# Patient Record
Sex: Female | Born: 1980 | Race: White | Hispanic: No | Marital: Married | Smoking: Never smoker
Health system: Southern US, Community
[De-identification: ages and names within clinical notes are randomized; demographics above are authoritative.]

## PROBLEM LIST (undated history)

## (undated) ENCOUNTER — Inpatient Hospital Stay (HOSPITAL_COMMUNITY): Payer: Self-pay

## (undated) DIAGNOSIS — F419 Anxiety disorder, unspecified: Secondary | ICD-10-CM

## (undated) DIAGNOSIS — Z98811 Dental restoration status: Secondary | ICD-10-CM

## (undated) DIAGNOSIS — D649 Anemia, unspecified: Secondary | ICD-10-CM

## (undated) DIAGNOSIS — F329 Major depressive disorder, single episode, unspecified: Secondary | ICD-10-CM

## (undated) DIAGNOSIS — Z8614 Personal history of Methicillin resistant Staphylococcus aureus infection: Secondary | ICD-10-CM

## (undated) DIAGNOSIS — S52501A Unspecified fracture of the lower end of right radius, initial encounter for closed fracture: Secondary | ICD-10-CM

## (undated) DIAGNOSIS — F32A Depression, unspecified: Secondary | ICD-10-CM

---

## 2004-01-25 ENCOUNTER — Other Ambulatory Visit: Admission: RE | Admit: 2004-01-25 | Discharge: 2004-01-25 | Payer: Self-pay | Admitting: Internal Medicine

## 2010-05-17 ENCOUNTER — Ambulatory Visit: Payer: Self-pay | Admitting: Internal Medicine

## 2010-05-17 DIAGNOSIS — Z888 Allergy status to other drugs, medicaments and biological substances status: Secondary | ICD-10-CM

## 2011-01-15 NOTE — Assessment & Plan Note (Signed)
Summary: RASH (PT RECOVERING FROM STAPH INFECTION) // RS   Vital Signs:  Patient profile:   30 year old female Height:      64.5 inches Weight:      150 pounds BMI:     25.44 Temp:     98.4 degrees F oral BP sitting:   96 / 64  (right arm) Cuff size:   regular  Vitals Entered By: Duard Brady LPN (May 17, 3473 10:14 AM) CC: new pt - c/o rash to torso noted this AM -  Is Patient Diabetic? No   CC:  new pt - c/o rash to torso noted this AM - .  History of Present Illness: 30 year old patient who is seen today to establish with the practice.  she is completing a 10 day course of sulfa for an infected sebaceous cyst that was I&D [place]  Today, she developed a generalized erythematous back to her rash.  Her health has been excellent  Preventive Screening-Counseling & Management  Alcohol-Tobacco     Smoking Status: never  Allergies (verified): 1)  ! Sulfa  Past History:  Past Medical History: sulfa allergy  Past Surgical History: none  Family History: Reviewed history and no changes required. mother with hypertension paternal grandfather died of lung cancer  Social History: Reviewed history and no changes required. 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  The patient denies anorexia, fever, weight loss, weight gain, vision loss, decreased hearing, hoarseness, chest pain, syncope, dyspnea on exertion, peripheral edema, prolonged cough, headaches, hemoptysis, abdominal pain, melena, hematochezia, severe indigestion/heartburn, hematuria, incontinence, genital sores, muscle weakness, transient blindness, difficulty walking, depression, unusual weight change, abnormal bleeding, enlarged lymph nodes, angioedema, and breast masses.    Physical Exam  General:  Well-developed,well-nourished,in no acute distress; alert,appropriate and cooperative throughout  examination Head:  Normocephalic and atraumatic without obvious abnormalities. No apparent alopecia or balding. Eyes:  No corneal or conjunctival inflammation noted. EOMI. Perrla. Funduscopic exam benign, without hemorrhages, exudates or papilledema. Vision grossly normal. Ears:  External ear exam shows no significant lesions or deformities.  Otoscopic examination reveals clear canals, tympanic membranes are intact bilaterally without bulging, retraction, inflammation or discharge. Hearing is grossly normal bilaterally. Mouth:  markedly enlarged tonsils, especially the left Neck:  No deformities, masses, or tenderness noted. Lungs:  Normal respiratory effort, chest expands symmetrically. Lungs are clear to auscultation, no crackles or wheezes. Heart:  Normal rate and regular rhythm. S1 and S2 normal without gallop, murmur, click, rub or other extra sounds. Abdomen:  Bowel sounds positive,abdomen soft and non-tender without masses, organomegaly or hernias noted. Msk:  No deformity or scoliosis noted of thoracic or lumbar spine.   Pulses:  R and L carotid,radial,femoral,dorsalis pedis and posterior tibial pulses are full and equal bilaterally Extremities:  No clubbing, cyanosis, edema, or deformity noted with normal full range of motion of all joints.   Neurologic:  No cranial nerve deficits noted. Station and gait are normal. Plantar reflexes are down-going bilaterally. DTRs are symmetrical throughout. Sensory, motor and coordinative functions appear intact. Skin:  generalized erythematous, macular rash   Impression & Recommendations:  Problem # 1:  ADVERSE DRUG REACTION, SULFA (ICD-995.27)  Complete Medication List: 1)  Septra Ds 800-160 Mg Tabs (Sulfamethoxazole-trimethoprim) .... X 10day  Patient Instructions: 1)  discontinue sulfa antibiotic 2)  Please schedule a follow-up appointment as needed.

## 2011-04-15 ENCOUNTER — Emergency Department (HOSPITAL_COMMUNITY)
Admission: EM | Admit: 2011-04-15 | Discharge: 2011-04-16 | Discharge status: Against Medical Advice | Payer: Self-pay | Attending: Emergency Medicine | Admitting: Emergency Medicine

## 2011-04-15 DIAGNOSIS — R079 Chest pain, unspecified: Secondary | ICD-10-CM | POA: Insufficient documentation

## 2011-06-22 ENCOUNTER — Emergency Department (HOSPITAL_COMMUNITY)
Admission: EM | Admit: 2011-06-22 | Discharge: 2011-06-22 | Disposition: A | Discharge status: Home / Self Care | Payer: Medicaid Other | Attending: Emergency Medicine | Admitting: Emergency Medicine

## 2011-06-22 DIAGNOSIS — O210 Mild hyperemesis gravidarum: Secondary | ICD-10-CM | POA: Insufficient documentation

## 2011-06-22 DIAGNOSIS — O99891 Other specified diseases and conditions complicating pregnancy: Secondary | ICD-10-CM | POA: Insufficient documentation

## 2011-06-22 DIAGNOSIS — R109 Unspecified abdominal pain: Secondary | ICD-10-CM | POA: Insufficient documentation

## 2011-06-22 LAB — URINALYSIS, ROUTINE W REFLEX MICROSCOPIC
Bilirubin Urine: NEGATIVE
Ketones, ur: NEGATIVE mg/dL
Nitrite: NEGATIVE
Protein, ur: NEGATIVE mg/dL
Specific Gravity, Urine: 1.028 (ref 1.005–1.030)
Urobilinogen, UA: 0.2 mg/dL (ref 0.0–1.0)

## 2011-06-22 LAB — POCT PREGNANCY, URINE: Preg Test, Ur: POSITIVE

## 2011-06-22 LAB — WET PREP, GENITAL: Trich, Wet Prep: NONE SEEN

## 2011-06-24 LAB — GC/CHLAMYDIA PROBE AMP, GENITAL: Chlamydia, DNA Probe: NEGATIVE

## 2011-07-10 LAB — RPR: RPR: NONREACTIVE

## 2011-07-10 LAB — RUBELLA ANTIBODY, IGM: Rubella: NON-IMMUNE/NOT IMMUNE

## 2011-07-10 LAB — ABO/RH

## 2011-07-10 LAB — HEPATITIS B SURFACE ANTIGEN: Hepatitis B Surface Ag: NEGATIVE

## 2011-08-09 ENCOUNTER — Emergency Department (HOSPITAL_COMMUNITY)
Admission: EM | Admit: 2011-08-09 | Discharge: 2011-08-10 | Disposition: A | Discharge status: Home / Self Care | Payer: Medicaid Other | Attending: Emergency Medicine | Admitting: Emergency Medicine

## 2011-08-09 DIAGNOSIS — O99891 Other specified diseases and conditions complicating pregnancy: Secondary | ICD-10-CM | POA: Insufficient documentation

## 2011-08-09 DIAGNOSIS — R51 Headache: Secondary | ICD-10-CM | POA: Insufficient documentation

## 2011-08-09 DIAGNOSIS — R109 Unspecified abdominal pain: Secondary | ICD-10-CM | POA: Insufficient documentation

## 2011-08-09 DIAGNOSIS — O210 Mild hyperemesis gravidarum: Secondary | ICD-10-CM | POA: Insufficient documentation

## 2011-08-09 LAB — POCT I-STAT, CHEM 8
Calcium, Ion: 1.17 mmol/L (ref 1.12–1.32)
Creatinine, Ser: 0.6 mg/dL (ref 0.50–1.10)
Glucose, Bld: 89 mg/dL (ref 70–99)
HCT: 36 % (ref 36.0–46.0)
Hemoglobin: 12.2 g/dL (ref 12.0–15.0)

## 2011-08-09 LAB — URINALYSIS, ROUTINE W REFLEX MICROSCOPIC
Nitrite: NEGATIVE
Protein, ur: NEGATIVE mg/dL
Specific Gravity, Urine: 1.025 (ref 1.005–1.030)
Urobilinogen, UA: 1 mg/dL (ref 0.0–1.0)

## 2011-08-09 LAB — POCT PREGNANCY, URINE: Preg Test, Ur: POSITIVE

## 2011-08-09 LAB — URINE MICROSCOPIC-ADD ON

## 2011-08-10 LAB — DIFFERENTIAL
Basophils Absolute: 0 10*3/uL (ref 0.0–0.1)
Eosinophils Absolute: 0.3 10*3/uL (ref 0.0–0.7)
Eosinophils Relative: 4 % (ref 0–5)
Lymphs Abs: 2.2 10*3/uL (ref 0.7–4.0)
Monocytes Absolute: 0.5 10*3/uL (ref 0.1–1.0)

## 2011-08-10 LAB — CBC
HCT: 35.1 % — ABNORMAL LOW (ref 36.0–46.0)
MCHC: 35 g/dL (ref 30.0–36.0)
MCV: 82.8 fL (ref 78.0–100.0)
Platelets: 206 10*3/uL (ref 150–400)
RDW: 12 % (ref 11.5–15.5)
WBC: 9.1 10*3/uL (ref 4.0–10.5)

## 2011-08-11 LAB — URINE CULTURE: Culture  Setup Time: 201208250234

## 2011-11-12 ENCOUNTER — Other Ambulatory Visit: Payer: Self-pay | Admitting: Obstetrics

## 2011-11-12 ENCOUNTER — Inpatient Hospital Stay (HOSPITAL_COMMUNITY)
Admission: AD | Admit: 2011-11-12 | Discharge: 2011-11-12 | Disposition: A | Discharge status: Home / Self Care | Payer: Medicaid Other | Source: Ambulatory Visit | Attending: Obstetrics | Admitting: Obstetrics

## 2011-11-12 DIAGNOSIS — Z298 Encounter for other specified prophylactic measures: Secondary | ICD-10-CM | POA: Insufficient documentation

## 2011-11-12 DIAGNOSIS — Z3480 Encounter for supervision of other normal pregnancy, unspecified trimester: Secondary | ICD-10-CM | POA: Insufficient documentation

## 2011-11-12 DIAGNOSIS — Z2989 Encounter for other specified prophylactic measures: Secondary | ICD-10-CM | POA: Insufficient documentation

## 2011-11-12 MED ORDER — RHO D IMMUNE GLOBULIN 1500 UNIT/2ML IJ SOLN
300.0000 ug | Freq: Once | INTRAMUSCULAR | Status: AC
  Administered 2011-11-12: 300 ug via INTRAMUSCULAR
  Filled 2011-11-12: qty 2

## 2011-11-12 NOTE — Progress Notes (Signed)
Sent over for rhophyllac inj, received with 1st preg, had a miscarriage.  Hand out given. Pt is 29wks.  No complication or complaints.  NKDA.  Time associated with lab draw and injection discussed.

## 2011-11-13 LAB — RH IG WORKUP (INCLUDES ABO/RH)
Antibody Screen: NEGATIVE
Fetal Screen: NEGATIVE
Gestational Age(Wks): 29

## 2011-12-17 NOTE — L&D Delivery Note (Signed)
Delivery Note At 7:55 AM a viable female was delivered via  (Presentation: ;  ).  APGAR: , ; weight .   Placenta status: , .  Cord:  with the following complications: .  Cord pH: not done  Anesthesia:   Episiotomy:  Lacerations:  Suture Repair: 2.0 Est. Blood Loss (mL):   Mom to postpartum.  Baby to nursery-stable.  Majorie Santee A 01/31/2012, 8:08 AM

## 2012-01-01 ENCOUNTER — Other Ambulatory Visit (HOSPITAL_COMMUNITY): Payer: Self-pay | Admitting: Obstetrics

## 2012-01-01 DIAGNOSIS — R52 Pain, unspecified: Secondary | ICD-10-CM

## 2012-01-02 ENCOUNTER — Ambulatory Visit (HOSPITAL_COMMUNITY)
Admission: RE | Admit: 2012-01-02 | Discharge: 2012-01-02 | Disposition: A | Discharge status: Home / Self Care | Payer: Medicaid Other | Source: Ambulatory Visit | Attending: Obstetrics | Admitting: Obstetrics

## 2012-01-02 DIAGNOSIS — M545 Low back pain, unspecified: Secondary | ICD-10-CM | POA: Insufficient documentation

## 2012-01-02 DIAGNOSIS — O99891 Other specified diseases and conditions complicating pregnancy: Secondary | ICD-10-CM | POA: Insufficient documentation

## 2012-01-02 DIAGNOSIS — R52 Pain, unspecified: Secondary | ICD-10-CM | POA: Insufficient documentation

## 2012-01-12 ENCOUNTER — Encounter (HOSPITAL_COMMUNITY): Payer: Self-pay | Admitting: *Deleted

## 2012-01-12 ENCOUNTER — Inpatient Hospital Stay (HOSPITAL_COMMUNITY)
Admission: AD | Admit: 2012-01-12 | Discharge: 2012-01-12 | Disposition: A | Discharge status: Home / Self Care | Payer: Medicaid Other | Source: Ambulatory Visit | Attending: Obstetrics | Admitting: Obstetrics

## 2012-01-12 DIAGNOSIS — O471 False labor at or after 37 completed weeks of gestation: Secondary | ICD-10-CM

## 2012-01-12 DIAGNOSIS — O479 False labor, unspecified: Secondary | ICD-10-CM | POA: Insufficient documentation

## 2012-01-12 MED ORDER — OXYCODONE-ACETAMINOPHEN 5-325 MG PO TABS
2.0000 | ORAL_TABLET | Freq: Once | ORAL | Status: AC
  Administered 2012-01-12: 2 via ORAL
  Filled 2012-01-12: qty 2

## 2012-01-12 NOTE — Progress Notes (Signed)
Pt states contracting since last night.   No bleeding or leaking.

## 2012-01-12 NOTE — ED Provider Notes (Signed)
Olivia Alvarez is a 31 y.o. female presenting for contractions and back pain. Maternal Medical History:  Reason for admission: Reason for admission: contractions and nausea (mild nausea on occasion).  Back pain  Contractions: Onset was 2 days ago.   Frequency: irregular.   Perceived severity is mild.    Fetal activity: Perceived fetal activity is normal.   Last perceived fetal movement was within the past hour.    Prenatal complications: no prenatal complications Prenatal Complications - Diabetes: none.    OB History    Grav Para Term Preterm Abortions TAB SAB Ect Mult Living   1              History reviewed. No pertinent past medical history. No past surgical history on file. Family History: family history is not on file. Social History:  does not have a smoking history on file. She does not have any smokeless tobacco history on file. Her alcohol and drug histories not on file.  Review of Systems  Constitutional: Negative for fever.  Eyes: Negative for blurred vision.  Respiratory: Negative for shortness of breath.   Cardiovascular: Negative for chest pain.  Gastrointestinal: Positive for nausea (mild nausea on occasion). Negative for vomiting.  Genitourinary: Negative for dysuria, urgency and frequency.  Musculoskeletal: Positive for back pain (generalized constant back pain).  Neurological: Negative for dizziness and headaches.  Psychiatric/Behavioral: The patient is not nervous/anxious.     Dilation: 1 Effacement (%): 70 Station: -3 Exam by:: Elie Confer RN Blood pressure 100/82, pulse 100, temperature 97.8 F (36.6 C), temperature source Oral, resp. rate 18, height 5\' 6"  (1.676 m), weight 76.114 kg (167 lb 12.8 oz). Maternal Exam:  Uterine Assessment: Contraction strength is mild.  Contraction frequency is irregular.  UCs q6-9 mins  Abdomen: Estimated fetal weight is 6.5lbs.   Fetal presentation: vertex  Introitus: Normal vulva. Normal vagina.   Ferning test: not done.  Nitrazine test: not done. Amniotic fluid character: not assessed.  Pelvis: adequate for delivery.   Cervix: Cervix evaluated by digital exam.   Cervix evaluated by RN  Fetal Exam Fetal Monitor Review: Mode: ultrasound.   Baseline rate: 145.  Variability: moderate (6-25 bpm).   Pattern: accelerations present and no decelerations.    Fetal State Assessment: Category I - tracings are normal.     Physical Exam  Constitutional: She is oriented to person, place, and time. She appears well-developed and well-nourished.  HENT:  Head: Normocephalic.  Eyes: Pupils are equal, round, and reactive to light.  Neck: Normal range of motion.  Cardiovascular: Normal rate, regular rhythm and normal heart sounds.   Respiratory: Effort normal and breath sounds normal.  GI: Soft. Bowel sounds are normal. There is no tenderness.  Genitourinary: Vagina normal and uterus normal.  Musculoskeletal: Normal range of motion.  Neurological: She is alert and oriented to person, place, and time.  Skin: Skin is warm and dry.  Psychiatric: She has a normal mood and affect. Her behavior is normal. Judgment and thought content normal.    Prenatal labs: ABO, Rh: --/--/O NEG (11/27 1105) Antibody: NEG (11/27 1105) Rubella:   RPR:    HBsAg:    HIV:    GBS:     Assessment/Plan: DC home from triage, after PO Percocet 2 tabs. Given labor precautions, to keep next scheduled office appt.    HAMBY, Monifah Freehling 01/12/2012, 8:38 PM

## 2012-01-12 NOTE — Progress Notes (Signed)
Pt G1 at 37.2wks, having contractions and back pain.  Denies any problems with pregnancy.

## 2012-01-25 ENCOUNTER — Inpatient Hospital Stay (HOSPITAL_COMMUNITY)
Admission: AD | Admit: 2012-01-25 | Discharge: 2012-01-26 | Disposition: A | Discharge status: Home / Self Care | Payer: Medicaid Other | Source: Ambulatory Visit | Attending: Obstetrics | Admitting: Obstetrics

## 2012-01-25 ENCOUNTER — Encounter (HOSPITAL_COMMUNITY): Payer: Self-pay

## 2012-01-25 DIAGNOSIS — O479 False labor, unspecified: Secondary | ICD-10-CM | POA: Insufficient documentation

## 2012-01-25 NOTE — Progress Notes (Signed)
Patient is here for labor eval. She states that the ctx are q61m since 2100pm. She reports good fetal movement. Denies any vaginal bleeding or lof.

## 2012-01-25 NOTE — Progress Notes (Signed)
Dr Verdell Face notified of patient complaints, tracing, sve result. Order to discharge patient home with false labor precaution.

## 2012-01-30 ENCOUNTER — Telehealth (HOSPITAL_COMMUNITY): Payer: Self-pay | Admitting: *Deleted

## 2012-01-30 ENCOUNTER — Encounter (HOSPITAL_COMMUNITY): Payer: Self-pay | Admitting: *Deleted

## 2012-01-30 NOTE — Telephone Encounter (Signed)
Preadmission screen  

## 2012-01-31 ENCOUNTER — Encounter (HOSPITAL_COMMUNITY): Payer: Self-pay | Admitting: Anesthesiology

## 2012-01-31 ENCOUNTER — Inpatient Hospital Stay (HOSPITAL_COMMUNITY)
Admission: AD | Admit: 2012-01-31 | Discharge: 2012-02-02 | DRG: 775 | Disposition: A | Discharge status: Home / Self Care | Payer: Medicaid Other | Source: Ambulatory Visit | Attending: Obstetrics | Admitting: Obstetrics

## 2012-01-31 ENCOUNTER — Encounter (HOSPITAL_COMMUNITY): Payer: Self-pay | Admitting: *Deleted

## 2012-01-31 ENCOUNTER — Inpatient Hospital Stay (HOSPITAL_COMMUNITY): Payer: Medicaid Other | Admitting: Anesthesiology

## 2012-01-31 DIAGNOSIS — Z22330 Carrier of Group B streptococcus: Secondary | ICD-10-CM

## 2012-01-31 DIAGNOSIS — Z888 Allergy status to other drugs, medicaments and biological substances status: Secondary | ICD-10-CM

## 2012-01-31 DIAGNOSIS — O99892 Other specified diseases and conditions complicating childbirth: Principal | ICD-10-CM | POA: Diagnosis present

## 2012-01-31 LAB — CBC
HCT: 33.4 % — ABNORMAL LOW (ref 36.0–46.0)
Hemoglobin: 11.1 g/dL — ABNORMAL LOW (ref 12.0–15.0)
MCHC: 33.2 g/dL (ref 30.0–36.0)
RBC: 3.83 MIL/uL — ABNORMAL LOW (ref 3.87–5.11)
WBC: 8.2 10*3/uL (ref 4.0–10.5)

## 2012-01-31 LAB — POCT FERN TEST: Fern Test: POSITIVE

## 2012-01-31 MED ORDER — ZOLPIDEM TARTRATE 5 MG PO TABS: 5.0000 mg | ORAL_TABLET | Freq: Every evening | ORAL | Status: DC | PRN

## 2012-01-31 MED ORDER — BENZOCAINE-MENTHOL 20-0.5 % EX AERO
INHALATION_SPRAY | CUTANEOUS | Status: AC
  Filled 2012-01-31: qty 56

## 2012-01-31 MED ORDER — PENICILLIN G POTASSIUM 5000000 UNITS IJ SOLR
2.5000 10*6.[IU] | INTRAVENOUS | Status: DC
  Administered 2012-01-31: 2.5 10*6.[IU] via INTRAVENOUS
  Filled 2012-01-31 (×3): qty 2.5

## 2012-01-31 MED ORDER — EPHEDRINE 5 MG/ML INJ: 10.0000 mg | INTRAVENOUS | Status: DC | PRN

## 2012-01-31 MED ORDER — IBUPROFEN 600 MG PO TABS
600.0000 mg | ORAL_TABLET | Freq: Four times a day (QID) | ORAL | Status: DC
  Administered 2012-01-31 – 2012-02-02 (×9): 600 mg via ORAL
  Filled 2012-01-31 (×2): qty 1
  Filled 2012-01-31: qty 2
  Filled 2012-01-31 (×4): qty 1

## 2012-01-31 MED ORDER — IBUPROFEN 600 MG PO TABS: 600.0000 mg | ORAL_TABLET | Freq: Four times a day (QID) | ORAL | Status: DC | PRN

## 2012-01-31 MED ORDER — FENTANYL 2.5 MCG/ML BUPIVACAINE 1/10 % EPIDURAL INFUSION (WH - ANES)
14.0000 mL/h | INTRAMUSCULAR | Status: DC
  Administered 2012-01-31: 14 mL/h via EPIDURAL
  Filled 2012-01-31: qty 60

## 2012-01-31 MED ORDER — EPHEDRINE 5 MG/ML INJ
10.0000 mg | INTRAVENOUS | Status: DC | PRN
  Filled 2012-01-31: qty 4

## 2012-01-31 MED ORDER — PENICILLIN G POTASSIUM 5000000 UNITS IJ SOLR
5.0000 10*6.[IU] | Freq: Once | INTRAVENOUS | Status: AC
  Administered 2012-01-31: 5 10*6.[IU] via INTRAVENOUS
  Filled 2012-01-31: qty 5

## 2012-01-31 MED ORDER — LIDOCAINE HCL (PF) 1 % IJ SOLN
INTRAMUSCULAR | Status: DC | PRN
  Administered 2012-01-31 (×3): 4 mL

## 2012-01-31 MED ORDER — TERBUTALINE SULFATE 1 MG/ML IJ SOLN: 0.2500 mg | Freq: Once | INTRAMUSCULAR | Status: DC | PRN

## 2012-01-31 MED ORDER — ONDANSETRON HCL 4 MG/2ML IJ SOLN
4.0000 mg | Freq: Four times a day (QID) | INTRAMUSCULAR | Status: DC | PRN
  Administered 2012-01-31: 4 mg via INTRAVENOUS
  Filled 2012-01-31: qty 2

## 2012-01-31 MED ORDER — FLEET ENEMA 7-19 GM/118ML RE ENEM: 1.0000 | ENEMA | RECTAL | Status: DC | PRN

## 2012-01-31 MED ORDER — ONDANSETRON HCL 4 MG PO TABS: 4.0000 mg | ORAL_TABLET | ORAL | Status: DC | PRN

## 2012-01-31 MED ORDER — OXYTOCIN BOLUS FROM INFUSION
500.0000 mL | Freq: Once | INTRAVENOUS | Status: DC
  Filled 2012-01-31: qty 500

## 2012-01-31 MED ORDER — BUTORPHANOL TARTRATE 2 MG/ML IJ SOLN: 1.0000 mg | INTRAMUSCULAR | Status: DC | PRN

## 2012-01-31 MED ORDER — WITCH HAZEL-GLYCERIN EX PADS: 1.0000 "application " | MEDICATED_PAD | CUTANEOUS | Status: DC | PRN

## 2012-01-31 MED ORDER — SENNOSIDES-DOCUSATE SODIUM 8.6-50 MG PO TABS
2.0000 | ORAL_TABLET | Freq: Every day | ORAL | Status: DC
  Administered 2012-01-31 – 2012-02-01 (×2): 2 via ORAL

## 2012-01-31 MED ORDER — PRENATAL MULTIVITAMIN CH
1.0000 | ORAL_TABLET | Freq: Every day | ORAL | Status: DC
  Administered 2012-02-01 – 2012-02-02 (×2): 1 via ORAL
  Filled 2012-01-31 (×2): qty 1

## 2012-01-31 MED ORDER — ACETAMINOPHEN 325 MG PO TABS: 650.0000 mg | ORAL_TABLET | ORAL | Status: DC | PRN

## 2012-01-31 MED ORDER — ONDANSETRON HCL 4 MG/2ML IJ SOLN: 4.0000 mg | INTRAMUSCULAR | Status: DC | PRN

## 2012-01-31 MED ORDER — PHENYLEPHRINE 40 MCG/ML (10ML) SYRINGE FOR IV PUSH (FOR BLOOD PRESSURE SUPPORT): 80.0000 ug | PREFILLED_SYRINGE | INTRAVENOUS | Status: DC | PRN

## 2012-01-31 MED ORDER — DIPHENHYDRAMINE HCL 50 MG/ML IJ SOLN: 12.5000 mg | INTRAMUSCULAR | Status: DC | PRN

## 2012-01-31 MED ORDER — LANOLIN HYDROUS EX OINT: TOPICAL_OINTMENT | CUTANEOUS | Status: DC | PRN

## 2012-01-31 MED ORDER — OXYTOCIN 20 UNITS IN LACTATED RINGERS INFUSION - SIMPLE
125.0000 mL/h | Freq: Once | INTRAVENOUS | Status: AC
  Administered 2012-01-31: 999 mL/h via INTRAVENOUS

## 2012-01-31 MED ORDER — OXYCODONE-ACETAMINOPHEN 5-325 MG PO TABS
1.0000 | ORAL_TABLET | ORAL | Status: DC | PRN
  Administered 2012-01-31 – 2012-02-01 (×3): 1 via ORAL
  Administered 2012-02-01 (×2): 2 via ORAL
  Administered 2012-02-02: 1 via ORAL
  Filled 2012-01-31: qty 1
  Filled 2012-01-31: qty 2
  Filled 2012-01-31: qty 1

## 2012-01-31 MED ORDER — OXYCODONE-ACETAMINOPHEN 5-325 MG PO TABS
1.0000 | ORAL_TABLET | ORAL | Status: DC | PRN
  Filled 2012-01-31 (×2): qty 1
  Filled 2012-01-31: qty 2

## 2012-01-31 MED ORDER — CITRIC ACID-SODIUM CITRATE 334-500 MG/5ML PO SOLN: 30.0000 mL | ORAL | Status: DC | PRN

## 2012-01-31 MED ORDER — LIDOCAINE HCL (PF) 1 % IJ SOLN
30.0000 mL | INTRAMUSCULAR | Status: DC | PRN
  Filled 2012-01-31: qty 30

## 2012-01-31 MED ORDER — PHENYLEPHRINE 40 MCG/ML (10ML) SYRINGE FOR IV PUSH (FOR BLOOD PRESSURE SUPPORT)
80.0000 ug | PREFILLED_SYRINGE | INTRAVENOUS | Status: DC | PRN
  Filled 2012-01-31: qty 5

## 2012-01-31 MED ORDER — TETANUS-DIPHTH-ACELL PERTUSSIS 5-2.5-18.5 LF-MCG/0.5 IM SUSP: 0.5000 mL | Freq: Once | INTRAMUSCULAR | Status: DC

## 2012-01-31 MED ORDER — DIPHENHYDRAMINE HCL 25 MG PO CAPS: 25.0000 mg | ORAL_CAPSULE | Freq: Four times a day (QID) | ORAL | Status: DC | PRN

## 2012-01-31 MED ORDER — OXYTOCIN 20 UNITS IN LACTATED RINGERS INFUSION - SIMPLE
1.0000 m[IU]/min | INTRAVENOUS | Status: DC
  Administered 2012-01-31: 2 m[IU]/min via INTRAVENOUS
  Filled 2012-01-31: qty 1000

## 2012-01-31 MED ORDER — LACTATED RINGERS IV SOLN
INTRAVENOUS | Status: DC
  Administered 2012-01-31: 02:00:00 via INTRAVENOUS

## 2012-01-31 MED ORDER — FERROUS SULFATE 325 (65 FE) MG PO TABS
325.0000 mg | ORAL_TABLET | Freq: Two times a day (BID) | ORAL | Status: DC
  Administered 2012-02-01 – 2012-02-02 (×3): 325 mg via ORAL
  Filled 2012-01-31 (×3): qty 1

## 2012-01-31 MED ORDER — LACTATED RINGERS IV SOLN: 500.0000 mL | INTRAVENOUS | Status: DC | PRN

## 2012-01-31 MED ORDER — BENZOCAINE-MENTHOL 20-0.5 % EX AERO: 1.0000 "application " | INHALATION_SPRAY | CUTANEOUS | Status: DC | PRN

## 2012-01-31 MED ORDER — SIMETHICONE 80 MG PO CHEW: 80.0000 mg | CHEWABLE_TABLET | ORAL | Status: DC | PRN

## 2012-01-31 MED ORDER — LACTATED RINGERS IV SOLN: 500.0000 mL | Freq: Once | INTRAVENOUS | Status: DC

## 2012-01-31 MED ORDER — DIBUCAINE 1 % RE OINT: 1.0000 "application " | TOPICAL_OINTMENT | RECTAL | Status: DC | PRN

## 2012-01-31 NOTE — Progress Notes (Signed)
UR chart review completed.  

## 2012-01-31 NOTE — Anesthesia Procedure Notes (Signed)
Epidural Patient location during procedure: OB Start time: 01/31/2012 5:25 AM Reason for block: procedure for pain  Staffing Performed by: anesthesiologist   Preanesthetic Checklist Completed: patient identified, site marked, surgical consent, pre-op evaluation, timeout performed, IV checked, risks and benefits discussed and monitors and equipment checked  Epidural Patient position: sitting Prep: site prepped and draped and DuraPrep Patient monitoring: continuous pulse ox and blood pressure Approach: midline Injection technique: LOR air  Needle:  Needle type: Tuohy  Needle gauge: 17 G Needle length: 9 cm Catheter type: closed end flexible Catheter size: 19 Gauge Test dose: negative  Assessment Events: blood not aspirated, injection not painful, no injection resistance, negative IV test and no paresthesia  Additional Notes Discussed risk of headache, infection, bleeding, nerve injury and failed or incomplete block.  Patient voices understanding and wishes to proceed.

## 2012-01-31 NOTE — H&P (Signed)
This is Dr. 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

## 2012-01-31 NOTE — Anesthesia Preprocedure Evaluation (Signed)
Anesthesia Evaluation  Patient identified by MRN, date of birth, ID band Patient awake    Reviewed: Allergy & Precautions, H&P , NPO status , Patient's Chart, lab work & pertinent test results, reviewed documented beta blocker date and time   History of Anesthesia Complications Negative for: history of anesthetic complications  Airway Mallampati: I TM Distance: >3 FB Neck ROM: full    Dental  (+) Teeth Intact   Pulmonary neg pulmonary ROS,  clear to auscultation        Cardiovascular neg cardio ROS regular Normal    Neuro/Psych  Neuromuscular disease (has been on tylox for sciatica for several months now) Negative Psych ROS   GI/Hepatic Neg liver ROS, On reglan for nausea   Endo/Other  Negative Endocrine ROS  Renal/GU negative Renal ROS  Genitourinary negative   Musculoskeletal   Abdominal   Peds  Hematology negative hematology ROS (+)   Anesthesia Other Findings   Reproductive/Obstetrics (+) Pregnancy                           Anesthesia Physical Anesthesia Plan  ASA: II  Anesthesia Plan: Epidural   Post-op Pain Management:    Induction:   Airway Management Planned:   Additional Equipment:   Intra-op Plan:   Post-operative Plan:   Informed Consent: I have reviewed the patients History and Physical, chart, labs and discussed the procedure including the risks, benefits and alternatives for the proposed anesthesia with the patient or authorized representative who has indicated his/her understanding and acceptance.     Plan Discussed with:   Anesthesia Plan Comments:         Anesthesia Quick Evaluation

## 2012-01-31 NOTE — Progress Notes (Signed)
PT SAYS SROM AT 2345-  CLEAR.   NO FLUID NOW    DENIES HSV AND MRSA.  VE ON 13TH- 1 CM.  [facility] INDUCTION FOR MONDAY

## 2012-02-01 LAB — CBC
HCT: 30.8 % — ABNORMAL LOW (ref 36.0–46.0)
MCV: 88.8 fL (ref 78.0–100.0)
Platelets: 203 10*3/uL (ref 150–400)
RBC: 3.47 MIL/uL — ABNORMAL LOW (ref 3.87–5.11)
WBC: 11.6 10*3/uL — ABNORMAL HIGH (ref 4.0–10.5)

## 2012-02-01 NOTE — Progress Notes (Signed)
Post Partum Day 2 Subjective: no complaints  Objective: Blood pressure 129/83, pulse 64, temperature 98.3 F (36.8 C), temperature source Oral, resp. rate 20, height 5\' 6"  (1.676 m), weight 174 lb (78.926 kg), SpO2 96.00%, unknown if currently breastfeeding.  Physical Exam:  General: alert and no distress Lochia: appropriate Uterine Fundus: firm Incision: healing well DVT Evaluation: No evidence of DVT seen on physical exam.   Basename 01/31/12 0130  HGB 11.1*  HCT 33.4*    Assessment/Plan: Discharge home   LOS: 1 day   Warrick Llera A 02/01/2012, 6:18 AM

## 2012-02-01 NOTE — Anesthesia Postprocedure Evaluation (Signed)
Anesthesia Post Note  Patient: Olivia Alvarez State  Procedure(s) Performed: * No procedures listed *  Anesthesia type: Epidural  Patient location: Mother/Baby  Post pain: Pain level controlled  Post assessment: Post-op Vital signs reviewed  Last Vitals:  Filed Vitals:   02/01/12 0515  BP: 129/83  Pulse: 64  Temp: 36.8 C  Resp: 20    Post vital signs: Reviewed  Level of consciousness: awake  Complications: No apparent anesthesia complications

## 2012-02-01 NOTE — Progress Notes (Signed)
PSYCHOSOCIAL ASSESSMENT ~ MATERNAL/CHILD Name:  Olivia Alvarez Age:  29 day Referral Date 02/01/12 Reason/Source  Positive UDS for Opiates  I. FAMILY/HOME ENVIRONMENT A. Child's Legal Guardian  Parent(s) X Olivia Alvarez parent    DSS  Name  Olivia Alvarez (MOB) DOB  March 23, 1981 Age  31 y/o Address [address] [place], [place]  [address] Name  Olivia Alvarez (FOB) DOB Age C.   Other Support -  Clint's mother was also visiting today and interacting with baby.  II. PSYCHOSOCIAL DATA A. Information Source  Patient Interview X  Family Interview           Other B. Event organiser  Employment    [employer]  Yes   [employer]                              Self Pay   Food Stamps (Will re-apply)   WIC  Work Scientist, physiological Housing      Section 8     Maternity Care Coordination/Child Service Coordination/Early Intervention    School  Grade      Other Cultural and Environment Information Cultural Issues Impacting Care  III. STRENGTHS  Supportive family/friends  Yes   Adequate Resources  Yes Compliance with medical plan Yes Home prepared for Child (including basic supplies)  Yes Understanding of illness N/A           Other  IV. RISK FACTORS AND CURRENT PROBLEMS V. No Problems Note VI. Substance Abuse                                           Pt - History of prescription drug abuse.  Family             Mental Illness     Pt Family               Family/Relationship Issues   Pt Family      Abuse/Neglect/Domestic Violence   Pt Family   Financial Resources     Pt Family  Transportation     Pt Family  DSS Involvement    Pt Family  Adjustment to Illness    Pt Family   Knowledge/Cognitive Deficit   Pt Family   Compliance with Treatment   Pt Family   Basic Needs (food, housing, etc)  Pt Family  Housing Concerns    Pt Family  Other             VII. SOCIAL WORK ASSESSMENT SW received referral due to  positive UDS screen of baby for opiates.  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  She obtained Methadone from a friend and attempted to ween herself off the meds and may have used the Methadone in the early stage of her pregnancy.  MOB disclosed this information to her MD who recommended she be admitted to a drug treatment facility but she refused.  MOB said she has been prescribed Tylox by Dr. Gaynell Face, which she uses for back pain.  Confirmed this via chart review.  MOB said she took a 5-500 Tylox on 01/30/12.  MOB was informed the UDS was positive for baby and  she said she understood.  SW also informed her about the pending [facility] screen.  SW consulted unit RN, Milagros Reap, and Nursery RN, [employer].  They have been monitoring baby for withdrawal symptoms.  SW also consulted Nobie Putnam, LCSW.  SW concluded that due to inconclusive evidence of possible prolonged drug exposure to the baby, DSS was contacted.  SW gave report to [facility] of CPS [place] [phone], who said she would follow up with MOB and baby.  SW attempted to inform MOB that DSS was contacted but pt declined visit.  SW did inform MOB on first visit that there was a possibility DSS would be contacted due to positive drug screen.  Will follow up with MOB tomorrow.  MOB has Medicaid and adequate supplies for baby at home.  MOB stated she has a good support system.       VIII. SOCIAL WORK PLAN (In [place]) No Further Intervention Required/No Barriers to Discharge Psychosocial Support and Ongoing Assessment of Needs Patient/Family  Education Child Protective Services Report  [facility] DSS Randa Evens [place]     Date 02/01/12 Information/Referral to [employer]

## 2012-02-02 MED ORDER — OXYCODONE-ACETAMINOPHEN 5-500 MG PO CAPS: 1.0000 | ORAL_CAPSULE | ORAL | Status: DC | PRN

## 2012-02-02 MED ORDER — IBUPROFEN 600 MG PO TABS: 600.0000 mg | ORAL_TABLET | Freq: Four times a day (QID) | ORAL | Status: DC

## 2012-02-02 NOTE — Discharge Summary (Signed)
Obstetric Discharge Summary Reason for Admission: onset of labor Prenatal Procedures: ultrasound Intrapartum Procedures: spontaneous vaginal delivery Postpartum Procedures: none Complications-Operative and Postpartum: none Hemoglobin  Date Value Range Status  02/01/2012 10.2* 12.0-15.0 (g/dL) Final     HCT  Date Value Range Status  02/01/2012 30.8* 36.0-46.0 (%) Final    Discharge Diagnoses: Term Pregnancy-delivered  Discharge Information: Date: 02/02/2012 Activity: pelvic rest Diet: routine Medications: PNV, Ibuprofen and Percocet Condition: stable Instructions: refer to practice specific booklet Discharge to: home Follow-up Information    Follow up with MARSHALL,BERNARD A, MD. Schedule an appointment as soon as possible for a visit in 6 weeks.   Contact information:   [address] [place] [address] [phone]          Newborn Data: Live born female  Birth Weight: 6 lb 6.7 oz (2912 g) APGAR: 9, 9  Home with mother.  Corinna Burkman A 02/02/2012, 10:19 AM

## 2012-02-02 NOTE — Progress Notes (Signed)
Pt was informed CPS was given a report regarding her baby's UDS being positive for opiates.  She stated she understood.

## 2012-02-02 NOTE — Progress Notes (Signed)
Post Partum Day 2 Subjective: no complaints  Objective: Blood pressure 131/84, pulse 69, temperature 98.4 F (36.9 C), temperature source Oral, resp. rate 18, height 5\' 6"  (1.676 m), weight 174 lb (78.926 kg), SpO2 96.00%, unknown if currently breastfeeding.  Physical Exam:  General: alert and no distress Lochia: appropriate Uterine Fundus: firm Incision: healing well DVT Evaluation: No evidence of DVT seen on physical exam.   Basename 02/01/12 0604 01/31/12 0130  HGB 10.2* 11.1*  HCT 30.8* 33.4*    Assessment/Plan: Discharge home   LOS: 2 days   Tymira Horkey A 02/02/2012, 10:15 AM

## 2012-02-03 ENCOUNTER — Inpatient Hospital Stay (HOSPITAL_COMMUNITY): Admission: RE | Admit: 2012-02-03 | Payer: Medicaid Other | Source: Ambulatory Visit

## 2012-11-26 ENCOUNTER — Emergency Department (HOSPITAL_COMMUNITY)
Admission: EM | Admit: 2012-11-26 | Discharge: 2012-11-26 | Disposition: A | Discharge status: Home / Self Care | Payer: Medicaid Other | Attending: Emergency Medicine | Admitting: Emergency Medicine

## 2012-11-26 ENCOUNTER — Emergency Department (HOSPITAL_COMMUNITY): Payer: Medicaid Other

## 2012-11-26 ENCOUNTER — Encounter (HOSPITAL_COMMUNITY): Payer: Self-pay

## 2012-11-26 DIAGNOSIS — S46909A Unspecified injury of unspecified muscle, fascia and tendon at shoulder and upper arm level, unspecified arm, initial encounter: Secondary | ICD-10-CM | POA: Insufficient documentation

## 2012-11-26 DIAGNOSIS — S4980XA Other specified injuries of shoulder and upper arm, unspecified arm, initial encounter: Secondary | ICD-10-CM | POA: Insufficient documentation

## 2012-11-26 DIAGNOSIS — IMO0002 Reserved for concepts with insufficient information to code with codable children: Secondary | ICD-10-CM | POA: Insufficient documentation

## 2012-11-26 DIAGNOSIS — Z8619 Personal history of other infectious and parasitic diseases: Secondary | ICD-10-CM | POA: Insufficient documentation

## 2012-11-26 DIAGNOSIS — Z3202 Encounter for pregnancy test, result negative: Secondary | ICD-10-CM | POA: Insufficient documentation

## 2012-11-26 DIAGNOSIS — Y92410 Unspecified street and highway as the place of occurrence of the external cause: Secondary | ICD-10-CM | POA: Insufficient documentation

## 2012-11-26 DIAGNOSIS — G8929 Other chronic pain: Secondary | ICD-10-CM | POA: Insufficient documentation

## 2012-11-26 DIAGNOSIS — T148XXA Other injury of unspecified body region, initial encounter: Secondary | ICD-10-CM

## 2012-11-26 DIAGNOSIS — Y9389 Activity, other specified: Secondary | ICD-10-CM | POA: Insufficient documentation

## 2012-11-26 DIAGNOSIS — M549 Dorsalgia, unspecified: Secondary | ICD-10-CM | POA: Insufficient documentation

## 2012-11-26 MED ORDER — NAPROXEN 250 MG PO TABS: 250.0000 mg | ORAL_TABLET | Freq: Two times a day (BID) | ORAL | Status: DC

## 2012-11-26 MED ORDER — CYCLOBENZAPRINE HCL 10 MG PO TABS: 10.0000 mg | ORAL_TABLET | Freq: Three times a day (TID) | ORAL | Status: DC | PRN

## 2012-11-26 NOTE — ED Notes (Signed)
Patient transported to X-ray 
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
## 2012-11-26 NOTE — ED Notes (Addendum)
Pt returns from radiology pt pain remains

## 2012-11-26 NOTE — ED Notes (Signed)
Patient transported to CT 
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
## 2014-08-01 ENCOUNTER — Encounter (HOSPITAL_COMMUNITY): Payer: Self-pay | Admitting: Emergency Medicine

## 2014-08-01 ENCOUNTER — Inpatient Hospital Stay (HOSPITAL_COMMUNITY)
Admission: AD | Admit: 2014-08-01 | Discharge: 2014-08-01 | Disposition: A | Discharge status: Home / Self Care | Payer: Self-pay | Source: Ambulatory Visit | Attending: Obstetrics & Gynecology | Admitting: Obstetrics & Gynecology

## 2014-08-01 ENCOUNTER — Inpatient Hospital Stay (HOSPITAL_COMMUNITY): Payer: Medicaid Other

## 2014-08-01 ENCOUNTER — Encounter (HOSPITAL_COMMUNITY): Payer: Self-pay

## 2014-08-01 ENCOUNTER — Emergency Department (HOSPITAL_COMMUNITY)
Admission: EM | Admit: 2014-08-01 | Discharge: 2014-08-01 | Disposition: A | Discharge status: Home / Self Care | Payer: Medicaid Other | Attending: Emergency Medicine | Admitting: Emergency Medicine

## 2014-08-01 DIAGNOSIS — R109 Unspecified abdominal pain: Secondary | ICD-10-CM | POA: Insufficient documentation

## 2014-08-01 DIAGNOSIS — O200 Threatened abortion: Secondary | ICD-10-CM | POA: Insufficient documentation

## 2014-08-01 DIAGNOSIS — O4690 Antepartum hemorrhage, unspecified, unspecified trimester: Secondary | ICD-10-CM | POA: Insufficient documentation

## 2014-08-01 DIAGNOSIS — O209 Hemorrhage in early pregnancy, unspecified: Secondary | ICD-10-CM

## 2014-08-01 DIAGNOSIS — O9989 Other specified diseases and conditions complicating pregnancy, childbirth and the puerperium: Secondary | ICD-10-CM | POA: Insufficient documentation

## 2014-08-01 DIAGNOSIS — Z8619 Personal history of other infectious and parasitic diseases: Secondary | ICD-10-CM | POA: Insufficient documentation

## 2014-08-01 DIAGNOSIS — G8929 Other chronic pain: Secondary | ICD-10-CM | POA: Insufficient documentation

## 2014-08-01 LAB — BASIC METABOLIC PANEL
ANION GAP: 11 (ref 5–15)
BUN: 9 mg/dL (ref 6–23)
CALCIUM: 9.4 mg/dL (ref 8.4–10.5)
CHLORIDE: 99 meq/L (ref 96–112)
CO2: 27 mEq/L (ref 19–32)
CREATININE: 0.72 mg/dL (ref 0.50–1.10)
GFR calc non Af Amer: >90 mL/min (ref >90)
Glucose, Bld: 93 mg/dL (ref 70–99)
Potassium: 4.2 mEq/L (ref 3.7–5.3)
Sodium: 137 mEq/L (ref 137–147)

## 2014-08-01 LAB — CBC WITH DIFFERENTIAL/PLATELET
BASOS ABS: 0 10*3/uL (ref 0.0–0.1)
BASOS PCT: 0 % (ref 0–1)
EOS PCT: 3 % (ref 0–5)
Eosinophils Absolute: 0.3 10*3/uL (ref 0.0–0.7)
HEMATOCRIT: 38.3 % (ref 36.0–46.0)
Hemoglobin: 12.9 g/dL (ref 12.0–15.0)
Lymphocytes Relative: 23 % (ref 12–46)
Lymphs Abs: 1.9 10*3/uL (ref 0.7–4.0)
MCH: 29 pg (ref 26.0–34.0)
MCHC: 33.7 g/dL (ref 30.0–36.0)
MCV: 86.1 fL (ref 78.0–100.0)
MONO ABS: 0.4 10*3/uL (ref 0.1–1.0)
Monocytes Relative: 4 % (ref 3–12)
NEUTROS ABS: 6 10*3/uL (ref 1.7–7.7)
Neutrophils Relative %: 70 % (ref 43–77)
PLATELETS: 199 10*3/uL (ref 150–400)
RBC: 4.45 MIL/uL (ref 3.87–5.11)
RDW: 12.5 % (ref 11.5–15.5)
WBC: 8.6 10*3/uL (ref 4.0–10.5)

## 2014-08-01 LAB — HCG, QUANTITATIVE, PREGNANCY: hCG, Beta Chain, Quant, S: 6627 m[IU]/mL — ABNORMAL HIGH (ref <5)

## 2014-08-01 MED ORDER — PROMETHAZINE HCL 25 MG PO TABS
25.0000 mg | ORAL_TABLET | Freq: Once | ORAL | Status: AC
  Administered 2014-08-01: 25 mg via ORAL
  Filled 2014-08-01: qty 1

## 2014-08-01 MED ORDER — ONDANSETRON HCL 4 MG/2ML IJ SOLN
4.0000 mg | Freq: Once | INTRAMUSCULAR | Status: AC
  Administered 2014-08-01: 4 mg via INTRAVENOUS
  Filled 2014-08-01: qty 2

## 2014-08-01 MED ORDER — HYDROMORPHONE HCL PF 1 MG/ML IJ SOLN
0.5000 mg | Freq: Once | INTRAMUSCULAR | Status: AC
  Administered 2014-08-01: 0.5 mg via INTRAMUSCULAR
  Filled 2014-08-01: qty 1

## 2014-08-01 MED ORDER — RHO D IMMUNE GLOBULIN 1500 UNIT/2ML IJ SOSY
300.0000 ug | PREFILLED_SYRINGE | Freq: Once | INTRAMUSCULAR | Status: AC
  Administered 2014-08-01: 300 ug via INTRAMUSCULAR
  Filled 2014-08-01: qty 2

## 2014-08-01 MED ORDER — OXYCODONE-ACETAMINOPHEN 5-325 MG PO TABS: 1.0000 | ORAL_TABLET | ORAL | Status: DC | PRN

## 2014-08-01 MED ORDER — MORPHINE SULFATE 4 MG/ML IJ SOLN
4.0000 mg | Freq: Once | INTRAMUSCULAR | Status: AC
  Administered 2014-08-01: 4 mg via INTRAVENOUS
  Filled 2014-08-01: qty 1

## 2014-08-01 MED ORDER — PROMETHAZINE HCL 25 MG PO TABS: 25.0000 mg | ORAL_TABLET | Freq: Four times a day (QID) | ORAL | Status: DC | PRN

## 2014-08-01 MED ORDER — FENTANYL CITRATE 0.05 MG/ML IJ SOLN: 100.0000 ug | Freq: Once | INTRAMUSCULAR | Status: DC

## 2014-08-01 MED ORDER — OXYCODONE-ACETAMINOPHEN 5-325 MG PO TABS
2.0000 | ORAL_TABLET | Freq: Once | ORAL | Status: AC
  Administered 2014-08-01: 2 via ORAL
  Filled 2014-08-01: qty 2

## 2014-08-01 NOTE — ED Provider Notes (Signed)
Medical screening examination/treatment/procedure(s) were performed by non-physician practitioner and as supervising physician I was immediately available for consultation/collaboration.   Lalisa Kiehn L Faye Strohman, MD 08/01/14 2206 

## 2014-08-01 NOTE — MAU Provider Note (Signed)
History     CSN: [id number]  Arrival date and time: 08/01/14 1547   None     Chief Complaint  Patient presents with  . Vaginal Bleeding  . Abdominal Pain   HPI  [name] is a 33 y.o. female G3P1011 at Unknown gestation who presents to MAU with vaginal bleeding and abdominal pain. She was seen at [facility] ED earlier and sent here for an ultrasound. She was informed that based on her physical exam and the amount of vaginal bleeding she was experiencing that this was likely a SAB.   OB History   Grav Para Term Preterm Abortions TAB SAB Ect Mult Living   3 1 1  1  0 1   1      Past Medical History  Diagnosis Date  . Varicella   . Chronic back pain   . Misuse of prescription only drugs     opiates    Past Surgical History  Procedure Laterality Date  . Right arm broken      surgery    History reviewed. No pertinent family history.  History  Substance Use Topics  . Smoking status: Never Smoker   . Smokeless tobacco: Not on file  . Alcohol Use: No    Allergies:  Allergies  Allergen Reactions  . Sulfa Antibiotics Rash    Prescriptions prior to admission  Medication Sig Dispense Refill  . ibuprofen (ADVIL,MOTRIN) 200 MG tablet Take 800 mg by mouth every 6 (six) hours as needed for moderate pain.        Results for orders placed during the hospital encounter of 08/01/14 (from the past 48 hour(s))  RH IG WORKUP (INCLUDES ABO/RH)     Status: None   Collection Time    08/01/14  5:35 PM      Result Value Ref Range   Gestational Age(Wks) 5     ABO/RH(D) O NEG     Antibody Screen NEG     Unit Number [id number]     Blood Component Type RHIG     Unit division 00     Status of Unit ISSUED     Transfusion Status OK TO TRANSFUSE     US Ob Comp Less 14 Wks  08/01/2014   CLINICAL DATA:  Abdominal cramping.  Vaginal bleeding.  EXAM: OBSTETRIC <14 WK [place] AND TRANSVAGINAL OB US  TECHNIQUE: Both transabdominal and transvaginal ultrasound  examinations were performed for complete evaluation of the gestation as well as the maternal uterus, adnexal regions, and pelvic cul-de-sac. Transvaginal technique was performed to assess early pregnancy.  COMPARISON:  None.  FINDINGS: Intrauterine gestational sac: A single irregular shaped gestational sac is visualized. And appears located within the cervix.  Yolk sac:  No definite yolk sac or embryo identified.  Embryo:  No no definite yolk sac or embryo identified.  Cardiac Activity: No  MSD:  8.3  mm   5 w   3D  d  Maternal uterus/adnexae:  Subchorionic hemorrhage: Moderate subchorionic hemorrhage noted.  Right ovary: Normal  Left ovary: Normal  Other :None  Free fluid:  None  IMPRESSION: An irregularly shaped gestational sac is found within the cervix. No embryo is identified. Findings are suspicious for abortion in progress. Follow-up pelvic sonogram and serial beta HCG advised.   Electronically Signed   By: Signa Kell M.D.   On: 08/01/2014 18:56   US Ob Transvaginal  08/01/2014   CLINICAL DATA:  Abdominal cramping.  Vaginal bleeding.  EXAM: OBSTETRIC <  14 WK US AND TRANSVAGINAL OB US  TECHNIQUE: Both transabdominal and transvaginal ultrasound examinations were performed for complete evaluation of the gestation as well as the maternal uterus, adnexal regions, and pelvic cul-de-sac. Transvaginal technique was performed to assess early pregnancy.  COMPARISON:  None.  FINDINGS: Intrauterine gestational sac: A single irregular shaped gestational sac is visualized. And appears located within the cervix.  Yolk sac:  No definite yolk sac or embryo identified.  Embryo:  No no definite yolk sac or embryo identified.  Cardiac Activity: No  MSD:  8.3  mm   5 w   3D  d  Maternal uterus/adnexae:  Subchorionic hemorrhage: Moderate subchorionic hemorrhage noted.  Right ovary: Normal  Left ovary: Normal  Other :None  Free fluid:  None  IMPRESSION: An irregularly shaped gestational sac is found within the cervix. No  embryo is identified. Findings are suspicious for abortion in progress. Follow-up pelvic sonogram and serial beta HCG advised.   Electronically Signed   By: Signa Kellaylor  Stroud M.D.   On: 08/01/2014 18:56    Review of Systems  Gastrointestinal: Positive for nausea and abdominal pain (Bilateral lower abdominal pain ). Negative for vomiting, diarrhea and constipation.  Genitourinary: Negative for dysuria, urgency, frequency and hematuria.   Physical Exam   Blood pressure 111/72, pulse 92, temperature 98.9 F (37.2 C), temperature source Oral, resp. rate 20, height 5' 5.5" (1.664 m), weight 64.774 kg (142 lb 12.8 oz), SpO2 100.00%.  Physical Exam  Constitutional: She is oriented to person, place, and time. She appears well-developed and well-nourished. No distress.  HENT:  Head: Normocephalic.  Eyes: Pupils are equal, round, and reactive to light.  Neck: Neck supple.  Respiratory: Effort normal.  GI: Soft. There is tenderness in the right lower quadrant, suprapubic area and left lower quadrant.  Genitourinary:  Speculum exam: Vagina - Small amount of dark red blood in the vault, no odor Cervix - Cervix appears open; dark red clot vs gestational sac noted at cervical opening. Unable to dislodge with ring forceps.  Bimanual exam: Cervix open Uterus non tender, enlarged  Chaperone present for exam.   Neurological: She is alert and oriented to person, place, and time.  Skin: Skin is warm. She is not diaphoretic.    MAU Course  Procedures None  MDM O negative blood type  Rhogam given in MAU 0.5 mg IM Dilaudid 2 percocet and 25 mg of phenergan given at discharge.  Spoke with Dr. Bradly ChrisStroud with Radiology; likely SAB in progress.   Assessment and Plan   A:  1. Vaginal bleeding in pregnancy, first trimester ; likely SAB in progress   2. Threatened miscarriage    P:  Discharge home in stable condition Return to the clinic in 48 hours for beta hcg Bleeding precautions discussed RX:  Percocet        Phenergan   Return to MAU as needed, if symptoms worsen Support given Rhogam given   Olivia HansenJennifer Irene Jamisyn Langer, NP  08/01/2014, 8:11 PM

## 2014-08-01 NOTE — Discharge Instructions (Signed)
[facility]. Dr. Tamela OddiJackson-Moore is aware that you will be there.

## 2014-08-01 NOTE — Progress Notes (Signed)
  CARE MANAGEMENT ED NOTE 08/01/2014  Patient:  Olivia Alvarez,Olivia Alvarez   Account Number:  [id number]  Date Initiated:  08/01/2014  Documentation initiated by:  Edd ArbourGIBBS,Constantina  Subjective/Objective Assessment:   33 yr old self pay [place] resident Pt states 1 week ago she found out she was pregnant and is now having abd pain that started today and vaginal bleeding x 2 days and has gotten worse this morning. Pt has passed clots.     Subjective/Objective Assessment Detail:   no pcp, no ob gyn  Reports she just found out she was pregnant and did not have a provider  Female visitor at bedside     Action/Plan:   ED CM spoke with pt discussed [place] [facility]   Action/Plan Detail:   Anticipated DC Date:       Status Recommendation to Physician:   Result of Recommendation:    Other ED Services  Consult Working Plan    DC Planning Services  Other  PCP issues  Outpatient Services - Pt will follow up    Choice offered to / List presented to:            Status of service:  Completed, signed off  ED Comments:   ED Comments Detail:  Follow-up With Details Comments Contact Info [facility]    [address] [place] [phone] follow up with the list of [place] lf pay providers provided by Case manager  Schedule an appointment as soon as possible for a visit As needed

## 2014-08-01 NOTE — MAU Note (Signed)
[facility] and sent to [facility] for evaluation. States she started spotting last night and heavy today with a lot of cramping

## 2014-08-01 NOTE — ED Notes (Signed)
Pt states 1 week ago she found out she was pregnant and is now having abd pain that started today and vaginal bleeding x 2 days and has gotten worse this morning. Pt has passed clots.

## 2014-08-01 NOTE — ED Notes (Signed)
Kaitlyn PA notified pelvis is set up

## 2014-08-01 NOTE — ED Notes (Signed)
Kaitlyn PA is going to cancel wet prep and GC swabs because there was too much blood to get an accurate specimen

## 2014-08-01 NOTE — Discharge Instructions (Signed)
Threatened Miscarriage °A threatened miscarriage occurs when you have vaginal bleeding during your first 20 weeks of pregnancy but the pregnancy has not ended. If you have vaginal bleeding during this time, your health care provider will do tests to make sure you are still pregnant. If the tests show you are still pregnant and the developing baby (fetus) inside your womb (uterus) is still growing, your condition is considered a threatened miscarriage. °A threatened miscarriage does not mean your pregnancy will end, but it does increase the risk of losing your pregnancy (complete miscarriage). °CAUSES  °The cause of a threatened miscarriage is usually not known. If you go on to have a complete miscarriage, the most common cause is an abnormal number of chromosomes in the developing baby. Chromosomes are the structures inside cells that hold all your genetic material. °Some causes of vaginal bleeding that do not result in miscarriage include: °· Having sex. °· Having an infection. °· Normal hormone changes of pregnancy. °· Bleeding that occurs when an egg implants in your uterus. °RISK FACTORS °Risk factors for bleeding in early pregnancy include: °· Obesity. °· Smoking. °· Drinking excessive amounts of alcohol or caffeine. °· Recreational drug use. °SIGNS AND SYMPTOMS °· Light vaginal bleeding. °· Mild abdominal pain or cramps. °DIAGNOSIS  °If you have bleeding with or without abdominal pain before 20 weeks of pregnancy, your health care provider will do tests to check whether you are still pregnant. One important test involves using sound waves and a computer (ultrasound) to create images of the inside of your uterus. Other tests include an internal exam of your vagina and uterus (pelvic exam) and measurement of your baby's heart rate.  °You may be diagnosed with a threatened miscarriage if: °· Ultrasound testing shows you are still pregnant. °· Your baby's heart rate is strong. °· A pelvic exam shows that the  opening between your uterus and your vagina (cervix) is closed. °· Your heart rate and blood pressure are stable. °· Blood tests confirm you are still pregnant. °TREATMENT  °No treatments have been shown to prevent a threatened miscarriage from going on to a complete miscarriage. However, the right home care is important.  °HOME CARE INSTRUCTIONS  °· Make sure you keep all your appointments for prenatal care. This is very important. °· Get plenty of rest. °· Do not have sex or use tampons if you have vaginal bleeding. °· Do not douche. °· Do not smoke or use recreational drugs. °· Do not drink alcohol. °· Avoid caffeine. °SEEK MEDICAL CARE IF: °· You have light vaginal bleeding or spotting while pregnant. °· You have abdominal pain or cramping. °· You have a fever. °SEEK IMMEDIATE MEDICAL CARE IF: °· You have heavy vaginal bleeding. °· You have blood clots coming from your vagina. °· You have severe low back pain or abdominal cramps. °· You have fever, chills, and severe abdominal pain. °MAKE SURE YOU: °· Understand these instructions. °· Will watch your condition. °· Will get help right away if you are not doing well or get worse. °Document Released: 12/02/2005 Document Revised: 12/07/2013 Document Reviewed: 09/28/2013 °ExitCare® Patient Information ©2015 [employer]. This information is not intended to replace advice given to you by your health care provider. Make sure you discuss any questions you have with your health care provider. ° °Vaginal Bleeding During Pregnancy, First Trimester °A small amount of bleeding (spotting) from the vagina is relatively common in early pregnancy. It usually stops on its own. Various things may cause bleeding   or spotting in early pregnancy. Some bleeding may be related to the pregnancy, and some may not. In most cases, the bleeding is normal and is not a problem. However, bleeding can also be a sign of something serious. Be sure to tell your health care provider about any  vaginal bleeding right away. °Some possible causes of vaginal bleeding during the first trimester include: °· Infection or inflammation of the cervix. °· Growths (polyps) on the cervix. °· Miscarriage or threatened miscarriage. °· Pregnancy tissue has developed outside of the uterus and in a fallopian tube (tubal pregnancy). °· Tiny cysts have developed in the uterus instead of pregnancy tissue (molar pregnancy). °HOME CARE INSTRUCTIONS  °Watch your condition for any changes. The following actions may help to lessen any discomfort you are feeling: °· Follow your health care provider's instructions for limiting your activity. If your health care provider orders bed rest, you may need to stay in bed and only get up to use the bathroom. However, your health care provider may allow you to continue light activity. °· If needed, make plans for someone to help with your regular activities and responsibilities while you are on bed rest. °· Keep track of the number of pads you use each day, how often you change pads, and how soaked (saturated) they are. Write this down. °· Do not use tampons. Do not douche. °· Do not have sexual intercourse or orgasms until approved by your health care provider. °· If you pass any tissue from your vagina, save the tissue so you can show it to your health care provider. °· Only take over-the-counter or prescription medicines as directed by your health care provider. °· Do not take aspirin because it can make you bleed. °· Keep all follow-up appointments as directed by your health care provider. °SEEK MEDICAL CARE IF: °· You have any vaginal bleeding during any part of your pregnancy. °· You have cramps or labor pains. °· You have a fever, not controlled by medicine. °SEEK IMMEDIATE MEDICAL CARE IF:  °· You have severe cramps in your back or belly (abdomen). °· You pass large clots or tissue from your vagina. °· Your bleeding increases. °· You feel light-headed or weak, or you have fainting  episodes. °· You have chills. °· You are leaking fluid or have a gush of fluid from your vagina. °· You pass out while having a bowel movement. °MAKE SURE YOU: °· Understand these instructions. °· Will watch your condition. °· Will get help right away if you are not doing well or get worse. °Document Released: 09/11/2005 Document Revised: 12/07/2013 Document Reviewed: 08/09/2013 °ExitCare® Patient Information ©2015 [employer]. This information is not intended to replace advice given to you by your health care provider. Make sure you discuss any questions you have with your health care provider. ° °

## 2014-08-01 NOTE — ED Provider Notes (Signed)
CSN: [id number]     Arrival date & time 08/01/14  1159 History   First MD Initiated Contact with Patient 08/01/14 1224     Chief Complaint  Patient presents with  . Vaginal Bleeding  . Abdominal Pain  . Possible Pregnancy     (Consider location/radiation/quality/duration/timing/severity/associated sxs/prior Treatment) HPI Comments: Patient is a 33 year old G36P1 female who presents with abdominal pain since this morning. Patient reports having a positive pregnancy test last week. She reports irregular periods for the past few months since stopping her Dep Provera so she is unsure how far along she is. The pain is located in her lower abdomen and does not radiate. The pain is described as cramping and severe. The pain started gradually and progressively worsened since the onset. No alleviating/aggravating factors. The patient has tried nothing for symptoms without relief. Associated symptoms include copious vaginal bleeding with clots. She is unable to quantify the amount of blood. Patient denies fever, headache, NVD, chest pain, SOB, dysuria, constipation.    Past Medical History  Diagnosis Date  . Varicella   . Chronic back pain   . Misuse of prescription only drugs     opiates   Past Surgical History  Procedure Laterality Date  . Right arm broken      surgery   No family history on file. History  Substance Use Topics  . Smoking status: Never Smoker   . Smokeless tobacco: Not on file  . Alcohol Use: No   OB History   Grav Para Term Preterm Abortions TAB SAB Ect Mult Living   2 1 1  1  0 1   1     Review of Systems  Constitutional: Negative for fever, chills and fatigue.  HENT: Negative for trouble swallowing.   Eyes: Negative for visual disturbance.  Respiratory: Negative for shortness of breath.   Cardiovascular: Negative for chest pain and palpitations.  Gastrointestinal: Positive for abdominal pain. Negative for nausea, vomiting and diarrhea.  Genitourinary: Positive  for vaginal bleeding. Negative for dysuria and difficulty urinating.  Musculoskeletal: Negative for arthralgias and neck pain.  Skin: Negative for color change.  Neurological: Negative for dizziness and weakness.  Psychiatric/Behavioral: Negative for dysphoric mood.      Allergies  Sulfa antibiotics  Home Medications   Prior to Admission medications   Medication Sig Start Date End Date Taking? Authorizing Provider  ibuprofen (ADVIL,MOTRIN) 200 MG tablet Take 200 mg by mouth every 6 (six) hours as needed for moderate pain.   Yes Historical Provider, MD   BP 119/77  Pulse 97  Temp(Src) 97.8 F (36.6 C) (Oral)  Resp 20  SpO2 99% Physical Exam  Nursing note and vitals reviewed. Constitutional: She is oriented to person, place, and time. She appears well-developed and well-nourished. No distress.  HENT:  Head: Normocephalic and atraumatic.  Eyes: Conjunctivae are normal.  Neck: Normal range of motion.  Cardiovascular: Normal rate and regular rhythm.  Exam reveals no gallop and no friction rub.   No murmur heard. Pulmonary/Chest: Effort normal and breath sounds normal. She has no wheezes. She has no rales. She exhibits no tenderness.  Abdominal: Soft. She exhibits no distension. There is tenderness. There is no rebound and no guarding.  Lower abdominal tenderness to palpation. No focal tenderness.   Genitourinary:  Normal external genitalia. Copious blood noted in vagina with dime-sized clots. Unable to visualize cervix. Generalized tenderness to palpation on bimanual exam.   Musculoskeletal: Normal range of motion.  Neurological: She is alert  and oriented to person, place, and time. Coordination normal.  Speech is goal-oriented. Moves limbs without ataxia.   Skin: Skin is warm and dry.  Psychiatric: She has a normal mood and affect. Her behavior is normal.    ED Course  Procedures (including critical care time) Labs Review Labs Reviewed  HCG, QUANTITATIVE, PREGNANCY -  Abnormal; Notable for the following:    hCG, Beta Chain, Quant, S 6627 (*)    All other components within normal limits  CBC WITH DIFFERENTIAL  BASIC METABOLIC PANEL    Imaging Review No results found.   EKG Interpretation None      MDM   Final diagnoses:  Vaginal bleeding in pregnancy, first trimester    2:15 PM Patient has stable vital signs and hemoglobin at 12.9. Patient having copious bleeding from vagina. I will consult OBGYN.   4:02 PM Patient will be discharged and [facility] and possible D and C for likely miscarriage. Vitals stable and patient afebrile. I spoke with Dr. Tamela OddiJackson-Moore who will see the patient. Patient and husband given instructions and express understanding.    Emilia BeckKaitlyn Ozie Dimaria, [place] JerseyPA-C 08/01/14 [phone]

## 2014-08-02 LAB — RH IG WORKUP (INCLUDES ABO/RH)
ABO/RH(D): O NEG
ANTIBODY SCREEN: NEGATIVE
Gestational Age(Wks): 5
Unit division: 0

## 2014-08-03 ENCOUNTER — Other Ambulatory Visit: Payer: Self-pay

## 2014-08-03 DIAGNOSIS — O039 Complete or unspecified spontaneous abortion without complication: Secondary | ICD-10-CM

## 2014-08-04 LAB — HCG, QUANTITATIVE, PREGNANCY: hCG, Beta Chain, Quant, S: 1022.1 m[IU]/mL

## 2014-10-17 ENCOUNTER — Encounter (HOSPITAL_COMMUNITY): Payer: Self-pay

## 2015-02-20 DIAGNOSIS — F32A Depression, unspecified: Secondary | ICD-10-CM | POA: Insufficient documentation

## 2015-02-20 DIAGNOSIS — F329 Major depressive disorder, single episode, unspecified: Secondary | ICD-10-CM | POA: Insufficient documentation

## 2015-02-20 DIAGNOSIS — F419 Anxiety disorder, unspecified: Secondary | ICD-10-CM | POA: Insufficient documentation

## 2015-02-22 DIAGNOSIS — G894 Chronic pain syndrome: Secondary | ICD-10-CM | POA: Insufficient documentation

## 2015-02-22 DIAGNOSIS — F1123 Opioid dependence with withdrawal: Secondary | ICD-10-CM | POA: Diagnosis present

## 2015-06-06 ENCOUNTER — Encounter (HOSPITAL_COMMUNITY): Payer: Self-pay | Admitting: *Deleted

## 2015-11-29 ENCOUNTER — Ambulatory Visit (INDEPENDENT_AMBULATORY_CARE_PROVIDER_SITE_OTHER): Payer: Medicaid Other | Admitting: Family Medicine

## 2015-11-29 ENCOUNTER — Encounter: Payer: Self-pay | Admitting: Family Medicine

## 2015-11-29 VITALS — BP 105/68 | HR 88 | Wt 166.0 lb

## 2015-11-29 DIAGNOSIS — Z23 Encounter for immunization: Secondary | ICD-10-CM

## 2015-11-29 DIAGNOSIS — Z3A29 29 weeks gestation of pregnancy: Secondary | ICD-10-CM | POA: Diagnosis not present

## 2015-11-29 DIAGNOSIS — O99320 Drug use complicating pregnancy, unspecified trimester: Secondary | ICD-10-CM | POA: Diagnosis not present

## 2015-11-29 DIAGNOSIS — Z79891 Long term (current) use of opiate analgesic: Secondary | ICD-10-CM | POA: Diagnosis not present

## 2015-11-29 DIAGNOSIS — O0993 Supervision of high risk pregnancy, unspecified, third trimester: Secondary | ICD-10-CM | POA: Diagnosis not present

## 2015-11-29 DIAGNOSIS — F1120 Opioid dependence, uncomplicated: Secondary | ICD-10-CM | POA: Diagnosis not present

## 2015-11-29 DIAGNOSIS — O360930 Maternal care for other rhesus isoimmunization, third trimester, not applicable or unspecified: Secondary | ICD-10-CM

## 2015-11-29 DIAGNOSIS — O0990 Supervision of high risk pregnancy, unspecified, unspecified trimester: Secondary | ICD-10-CM | POA: Insufficient documentation

## 2015-11-29 DIAGNOSIS — O26899 Other specified pregnancy related conditions, unspecified trimester: Secondary | ICD-10-CM

## 2015-11-29 DIAGNOSIS — Z6791 Unspecified blood type, Rh negative: Secondary | ICD-10-CM | POA: Insufficient documentation

## 2015-11-29 DIAGNOSIS — O4440 Low lying placenta NOS or without hemorrhage, unspecified trimester: Secondary | ICD-10-CM

## 2015-11-29 MED ORDER — RHO D IMMUNE GLOBULIN 1500 UNIT/2ML IJ SOSY
300.0000 ug | PREFILLED_SYRINGE | Freq: Once | INTRAMUSCULAR | Status: AC
  Administered 2015-11-29: 300 ug via INTRAMUSCULAR

## 2015-11-29 MED ORDER — RHO D IMMUNE GLOBULIN 1500 UNIT/2ML IJ SOSY: 300.0000 ug | PREFILLED_SYRINGE | Freq: Once | INTRAMUSCULAR | Status: DC

## 2015-11-29 NOTE — Patient Instructions (Signed)

## 2015-11-29 NOTE — Progress Notes (Signed)
Subjective:  Olivia Alvarez is a 34 y.o. [phone] at 1866w3d being seen today for initial prenatal care.  She was previously at Dr Elsie StainMarshall's office, but transferred care for personal reasons.  She is currently monitored for the following issues for this high-risk pregnancy and has ADVERSE DRUG REACTION, SULFA; Supervision of high risk pregnancy, antepartum; Pregnancy complicated by subutex maintenance, antepartum (HCC); Rh negative state in antepartum period; and Low lying placenta, antepartum on her problem list.  She has been on subutex during the entire pregnancy.  Prior to this, she was on methadone.  She denies relapses.  She started off the pregnancy on Subutex 16mg , but has weaned down to 8mg .  She denies withdrawal symptoms, tremors, drug dreams, cravings, etc.   In reviewing the patient's records from Dr Elsie StainMarshall's Office, she is Rh negative and has not received her dose of Rhogam.  She has not had her 28 week labs.  Additionally, her US for anatomic survey revealed a normal anatomy with a low lying placenta - no measurements were given as to how far the placenta is from the cervix.  Patient reports no complaints.   . Vag. Bleeding: None.  Movement: Present. Denies leaking of fluid.   The following portions of the patient's history were reviewed and updated as appropriate: allergies, current medications, past family history, past medical history, past social history, past surgical history and problem list. Problem list updated.  Objective:   Filed Vitals:   11/29/15 1407  BP: 105/68  Pulse: 88  Weight: 166 lb (75.297 kg)    Fetal Status:   Fundal Height: 30 cm Movement: Present     General:  Alert, oriented and cooperative. Patient is in no acute distress.  Skin: Skin is warm and dry. No rash noted.   Cardiovascular: Normal heart rate noted  Respiratory: Normal respiratory effort, no problems with respiration noted  Abdomen: Soft, gravid, appropriate for gestational age.  Pain/Pressure: Absent     Pelvic: Vag. Bleeding: None Vag D/C Character: Thin   Cervical exam deferred        Extremities: Normal range of motion.  Edema: None  Mental Status: Normal mood and affect. Normal behavior. Normal judgment and thought content.   Urinalysis: Urine Protein: Trace Urine Glucose: Trace  Assessment and Plan:  Pregnancy: G4P1021 at 3766w3d  1. Supervision of high risk pregnancy, antepartum, third trimester FHT and FH normal.   28 week labs. - US MFM OB LIMITED; Future - Glucose Tolerance, 1 HR (50g) - Flu Vaccine QUAD 36+ mos IM (Fluarix, Quad PF)  2. Pregnancy complicated by subutex maintenance, antepartum (HCC) I discussed with the patient that we do not recommend weaning off from Subutex while pregnant, as this can precipitate PTL and can place stress on the baby.  I also discussed that many patient on subutex actually need an increased dose during the last trimester, due to the metabolism of subutex by the placenta.  The patient is okay with this recommendation. - Flu Vaccine QUAD 36+ mos IM (Fluarix, Quad PF)  3. Low lying placenta, antepartum Will obtain a US to review the position of the placenta - US MFM OB LIMITED; Future  Preterm labor symptoms and general obstetric precautions including but not limited to vaginal bleeding, contractions, leaking of fluid and fetal movement were reviewed in detail with the patient. Please refer to After Visit Summary for other counseling recommendations.  Return in about 2 weeks (around 12/13/2015).   Levie HeritageJacob J Lennix Kneisel, DO

## 2015-11-30 LAB — CBC
HEMATOCRIT: 34.5 % — AB (ref 36.0–46.0)
Hemoglobin: 11.5 g/dL — ABNORMAL LOW (ref 12.0–15.0)
MCH: 29.3 pg (ref 26.0–34.0)
MCHC: 33.3 g/dL (ref 30.0–36.0)
MCV: 88 fL (ref 78.0–100.0)
MPV: 9.7 fL (ref 8.6–12.4)
Platelets: 247 10*3/uL (ref 150–400)
RBC: 3.92 MIL/uL (ref 3.87–5.11)
RDW: 12.4 % (ref 11.5–15.5)
WBC: 6.8 10*3/uL (ref 4.0–10.5)

## 2015-12-01 ENCOUNTER — Telehealth: Payer: Self-pay

## 2015-12-01 DIAGNOSIS — R7309 Other abnormal glucose: Secondary | ICD-10-CM

## 2015-12-01 LAB — LAB REPORT - SCANNED: RH TYPE: NEGATIVE

## 2015-12-01 LAB — HIV ANTIBODY (ROUTINE TESTING W REFLEX): HIV 1&2 Ab, 4th Generation: NONREACTIVE

## 2015-12-01 LAB — GLUCOSE TOLERANCE, 1 HOUR (50G) W/O FASTING: GLUCOSE 1 HOUR GTT: 142 mg/dL — AB (ref 70–140)

## 2015-12-01 LAB — RPR

## 2015-12-01 LAB — ABO AND RH: RH TYPE: NEGATIVE

## 2015-12-01 LAB — TYPE AND SCREEN

## 2015-12-01 NOTE — Telephone Encounter (Signed)
-----   Message from Levie HeritageJacob J Stinson, DO sent at 12/01/2015  6:34 AM EST ----- Patient has elevated 1hr GTT - needs 3hr GTT

## 2015-12-01 NOTE — Telephone Encounter (Signed)
Patient called and made aware that one hr glucose test was elevated and she will need to return to do a three hour test. Patient agrees to come Tuesday am at 830am. Patient instructed to be fasting after midnight the night before and she states understanding. Armandina StammerJennifer Bakari Nikolai RN BSN

## 2015-12-05 ENCOUNTER — Other Ambulatory Visit: Payer: Self-pay | Admitting: Family Medicine

## 2015-12-05 ENCOUNTER — Encounter (HOSPITAL_COMMUNITY): Payer: Self-pay

## 2015-12-05 ENCOUNTER — Ambulatory Visit (HOSPITAL_COMMUNITY)
Admission: RE | Admit: 2015-12-05 | Discharge: 2015-12-05 | Disposition: A | Discharge status: Home / Self Care | Payer: Medicaid Other | Source: Ambulatory Visit | Attending: Family Medicine | Admitting: Family Medicine

## 2015-12-05 DIAGNOSIS — O4403 Placenta previa specified as without hemorrhage, third trimester: Secondary | ICD-10-CM | POA: Diagnosis not present

## 2015-12-05 DIAGNOSIS — Z3A30 30 weeks gestation of pregnancy: Secondary | ICD-10-CM

## 2015-12-05 DIAGNOSIS — F1120 Opioid dependence, uncomplicated: Secondary | ICD-10-CM

## 2015-12-05 DIAGNOSIS — O0993 Supervision of high risk pregnancy, unspecified, third trimester: Secondary | ICD-10-CM

## 2015-12-05 DIAGNOSIS — F1920 Other psychoactive substance dependence, uncomplicated: Secondary | ICD-10-CM | POA: Insufficient documentation

## 2015-12-05 DIAGNOSIS — O4440 Low lying placenta NOS or without hemorrhage, unspecified trimester: Secondary | ICD-10-CM

## 2015-12-05 DIAGNOSIS — O99323 Drug use complicating pregnancy, third trimester: Secondary | ICD-10-CM | POA: Insufficient documentation

## 2015-12-05 DIAGNOSIS — Z3A20 20 weeks gestation of pregnancy: Secondary | ICD-10-CM

## 2015-12-05 DIAGNOSIS — O99320 Drug use complicating pregnancy, unspecified trimester: Secondary | ICD-10-CM

## 2015-12-07 LAB — GLUCOSE TOLERANCE, 3 HOURS
Glucose Tolerance, 1 hour: 135 mg/dL (ref 70–189)
Glucose Tolerance, 2 hour: 129 mg/dL (ref 70–164)
Glucose Tolerance, Fasting: 79 mg/dL (ref 65–99)
Glucose, GTT - 3 Hour: 97 mg/dL (ref 70–144)

## 2015-12-08 ENCOUNTER — Telehealth: Payer: Self-pay

## 2015-12-08 NOTE — Telephone Encounter (Signed)
-----   Message from Levie HeritageJacob J Stinson, DO sent at 12/08/2015 11:09 AM EST ----- 3hr GTT normal

## 2015-12-08 NOTE — Telephone Encounter (Signed)
Attempted to reach patient to give three hour normal results- but message states she is unavailable. Armandina StammerJennifer Haydin Calandra RN BSN

## 2015-12-17 NOTE — L&D Delivery Note (Signed)
Delivery Note Pt having early variables w/ ctx and now feeling urge to push. She pushed with approx 3-4 ctx and at 5:46 AM a viable female was delivered via Vaginal, Spontaneous Delivery (Presentation: ROA ).  APGAR: 9, 9; weight: pending.  Nuchal cord x 1 reduced prior to delivery. Cord clamped and cut by FOB; hospital cord blood sample collected. Placenta status: Intact, Spontaneous.  Cord: 3 vessels   Anesthesia: Epidural  Episiotomy: None Lacerations: 1st degree Suture Repair: 3.0 vicryl Est. Blood Loss (mL): 300  Mom to postpartum.  Baby to Couplet care / Skin to Skin.  SAKAI, HEINLE CNM 02/15/2016, 6:13 AM

## 2015-12-20 ENCOUNTER — Ambulatory Visit (INDEPENDENT_AMBULATORY_CARE_PROVIDER_SITE_OTHER): Payer: Medicaid Other | Admitting: Family Medicine

## 2015-12-20 VITALS — BP 100/74 | HR 87 | Wt 165.0 lb

## 2015-12-20 DIAGNOSIS — O0993 Supervision of high risk pregnancy, unspecified, third trimester: Secondary | ICD-10-CM

## 2015-12-20 DIAGNOSIS — F1120 Opioid dependence, uncomplicated: Secondary | ICD-10-CM

## 2015-12-20 DIAGNOSIS — Z79891 Long term (current) use of opiate analgesic: Secondary | ICD-10-CM

## 2015-12-20 DIAGNOSIS — O99320 Drug use complicating pregnancy, unspecified trimester: Secondary | ICD-10-CM

## 2015-12-20 NOTE — Patient Instructions (Signed)

## 2015-12-20 NOTE — Progress Notes (Signed)
Subjective:  Olivia Alvarez is a 35 y.o. [phone] at 531w3d being seen today for ongoing prenatal care.  She is currently monitored for the following issues for this high-risk pregnancy and has ADVERSE DRUG REACTION, SULFA; Supervision of high risk pregnancy, antepartum; Pregnancy complicated by subutex maintenance, antepartum (HCC); and Rh negative state in antepartum period on her problem list.   Subutex dose okay.  No symptoms of withdrawal.  Patient reports no complaints.  Contractions: Not present. Vag. Bleeding: None.  Movement: Present. Denies leaking of fluid.   The following portions of the patient's history were reviewed and updated as appropriate: allergies, current medications, past family history, past medical history, past social history, past surgical history and problem list. Problem list updated.  Objective:   Filed Vitals:   12/20/15 1353  BP: 100/74  Pulse: 87  Weight: 165 lb (74.844 kg)    Fetal Status: Fetal Heart Rate (bpm): 143   Movement: Present     General:  Alert, oriented and cooperative. Patient is in no acute distress.  Skin: Skin is warm and dry. No rash noted.   Cardiovascular: Normal heart rate noted  Respiratory: Normal respiratory effort, no problems with respiration noted  Abdomen: Soft, gravid, appropriate for gestational age. Pain/Pressure: Present     Pelvic: Vag. Bleeding: None Vag D/C Character: Thin   Cervical exam deferred        Extremities: Normal range of motion.  Edema: Trace  Mental Status: Normal mood and affect. Normal behavior. Normal judgment and thought content.   Urinalysis: Urine Protein: Trace Urine Glucose: Negative  Assessment and Plan:  Pregnancy: G4P1021 at 2631w3d  1. Supervision of high risk pregnancy, antepartum, third trimester FHT, FH normal.  Would like PP BTL - 30 day papers signed. 3hr GTT normal. Had rhogam  2. Pregnancy complicated by subutex maintenance, antepartum (HCC) Stable   Preterm labor  symptoms and general obstetric precautions including but not limited to vaginal bleeding, contractions, leaking of fluid and fetal movement were reviewed in detail with the patient. Please refer to After Visit Summary for other counseling recommendations.  No Follow-up on file.   Levie HeritageJacob J Stinson, DO

## 2015-12-21 ENCOUNTER — Encounter: Payer: Self-pay | Admitting: Family Medicine

## 2016-01-02 ENCOUNTER — Other Ambulatory Visit (HOSPITAL_COMMUNITY): Payer: Self-pay | Admitting: Maternal and Fetal Medicine

## 2016-01-02 ENCOUNTER — Ambulatory Visit (HOSPITAL_COMMUNITY)
Admission: RE | Admit: 2016-01-02 | Discharge: 2016-01-02 | Disposition: A | Discharge status: Home / Self Care | Payer: Medicaid Other | Source: Ambulatory Visit | Attending: Family Medicine | Admitting: Family Medicine

## 2016-01-02 ENCOUNTER — Encounter (HOSPITAL_COMMUNITY): Payer: Self-pay

## 2016-01-02 DIAGNOSIS — F1120 Opioid dependence, uncomplicated: Secondary | ICD-10-CM

## 2016-01-02 DIAGNOSIS — Z36 Encounter for antenatal screening of mother: Secondary | ICD-10-CM | POA: Diagnosis not present

## 2016-01-02 DIAGNOSIS — O99320 Drug use complicating pregnancy, unspecified trimester: Principal | ICD-10-CM

## 2016-01-02 DIAGNOSIS — F1920 Other psychoactive substance dependence, uncomplicated: Secondary | ICD-10-CM | POA: Insufficient documentation

## 2016-01-02 DIAGNOSIS — Z3A34 34 weeks gestation of pregnancy: Secondary | ICD-10-CM

## 2016-01-02 DIAGNOSIS — O99323 Drug use complicating pregnancy, third trimester: Secondary | ICD-10-CM | POA: Insufficient documentation

## 2016-01-03 ENCOUNTER — Ambulatory Visit (INDEPENDENT_AMBULATORY_CARE_PROVIDER_SITE_OTHER): Payer: Medicaid Other | Admitting: Family Medicine

## 2016-01-03 VITALS — BP 110/68 | HR 87 | Wt 167.0 lb

## 2016-01-03 DIAGNOSIS — O99320 Drug use complicating pregnancy, unspecified trimester: Secondary | ICD-10-CM | POA: Diagnosis not present

## 2016-01-03 DIAGNOSIS — F1120 Opioid dependence, uncomplicated: Secondary | ICD-10-CM

## 2016-01-03 DIAGNOSIS — O360130 Maternal care for anti-D [Rh] antibodies, third trimester, not applicable or unspecified: Secondary | ICD-10-CM

## 2016-01-03 DIAGNOSIS — O0993 Supervision of high risk pregnancy, unspecified, third trimester: Secondary | ICD-10-CM | POA: Diagnosis not present

## 2016-01-03 DIAGNOSIS — Z79891 Long term (current) use of opiate analgesic: Secondary | ICD-10-CM

## 2016-01-03 NOTE — Patient Instructions (Signed)

## 2016-01-03 NOTE — Progress Notes (Signed)
Subjective:  Olivia Alvarez is a 35 y.o. [phone] at [redacted]w[redacted]d being seen today for ongoing prenatal care.  She is currently monitored for the following issues for this high-risk pregnancy and has ADVERSE DRUG REACTION, SULFA; Supervision of high risk pregnancy, antepartum; Pregnancy complicated by subutex maintenance, antepartum (HCC); and Rh negative state in antepartum period on her problem list.  Did increase dose of subutex to  (  BID) due to withdrawal symptoms at night.  Patient reports no complaints.  Contractions: Not present. Vag. Bleeding: None.  Movement: Present. Denies leaking of fluid.   The following portions of the patient's history were reviewed and updated as appropriate: allergies, current medications, past family history, past medical history, past social history, past surgical history and problem list. Problem list updated.  Objective:   Filed Vitals:   01/03/16 1410  BP: 110/68  Pulse: 87  Weight: 167 lb (75.751 kg)    Fetal Status: Fetal Heart Rate (bpm): 130   Movement: Present     General:  Alert, oriented and cooperative. Patient is in no acute distress.  Skin: Skin is warm and dry. No rash noted.   Cardiovascular: Normal heart rate noted  Respiratory: Normal respiratory effort, no problems with respiration noted  Abdomen: Soft, gravid, appropriate for gestational age. Pain/Pressure: Absent     Pelvic: Vag. Bleeding: None Vag D/C Character: Thin   Cervical exam deferred        Extremities: Normal range of motion.  Edema: None  Mental Status: Normal mood and affect. Normal behavior. Normal judgment and thought content.   Urinalysis:      Assessment and Plan:  Pregnancy: G4P1021 at [redacted]w[redacted]d  1. Supervision of high risk pregnancy, antepartum, third trimester FH, FHT normal.  BP normal.  GC/CT, GBS next week.  2. Pregnancy complicated by subutex maintenance, antepartum (HCC) Continue subutex.  Will arrange NICU tour   3. Rh negative state in  antepartum period, third trimester, not applicable or unspecified fetus   Preterm labor symptoms and general obstetric precautions including but not limited to vaginal bleeding, contractions, leaking of fluid and fetal movement were reviewed in detail with the patient. Please refer to After Visit Summary for other counseling recommendations.  Return in about 2 weeks (around 01/17/2016) for OB f/u.   Levie Heritage, DO

## 2016-01-17 ENCOUNTER — Ambulatory Visit (INDEPENDENT_AMBULATORY_CARE_PROVIDER_SITE_OTHER): Payer: Medicaid Other | Admitting: Family Medicine

## 2016-01-17 ENCOUNTER — Other Ambulatory Visit (HOSPITAL_COMMUNITY)
Admission: RE | Admit: 2016-01-17 | Discharge: 2016-01-17 | Disposition: A | Discharge status: Home / Self Care | Payer: Medicaid Other | Source: Ambulatory Visit | Attending: Family Medicine | Admitting: Family Medicine

## 2016-01-17 VITALS — BP 119/77 | HR 85 | Wt 172.0 lb

## 2016-01-17 DIAGNOSIS — O99320 Drug use complicating pregnancy, unspecified trimester: Secondary | ICD-10-CM | POA: Diagnosis not present

## 2016-01-17 DIAGNOSIS — Z79891 Long term (current) use of opiate analgesic: Secondary | ICD-10-CM | POA: Diagnosis not present

## 2016-01-17 DIAGNOSIS — Z3490 Encounter for supervision of normal pregnancy, unspecified, unspecified trimester: Secondary | ICD-10-CM

## 2016-01-17 DIAGNOSIS — Z36 Encounter for antenatal screening of mother: Secondary | ICD-10-CM | POA: Diagnosis not present

## 2016-01-17 DIAGNOSIS — O0993 Supervision of high risk pregnancy, unspecified, third trimester: Secondary | ICD-10-CM | POA: Diagnosis not present

## 2016-01-17 DIAGNOSIS — F1120 Opioid dependence, uncomplicated: Secondary | ICD-10-CM

## 2016-01-17 DIAGNOSIS — Z113 Encounter for screening for infections with a predominantly sexual mode of transmission: Secondary | ICD-10-CM | POA: Insufficient documentation

## 2016-01-17 LAB — OB RESULTS CONSOLE GBS: STREP GROUP B AG: POSITIVE

## 2016-01-17 LAB — OB RESULTS CONSOLE GC/CHLAMYDIA: GC PROBE AMP, GENITAL: NEGATIVE

## 2016-01-17 NOTE — Progress Notes (Signed)
Subjective:  Olivia Alvarez is a 35 y.o. [phone] at [redacted]w[redacted]d being seen today for ongoing prenatal care.  She is currently monitored for the following issues for this high-risk pregnancy and has ADVERSE DRUG REACTION, SULFA; Supervision of high risk pregnancy, antepartum; Pregnancy complicated by subutex maintenance, antepartum (HCC); Rh negative state in antepartum period; and Anxiety and depression on her problem list.  Patient reports no complaints.  Contractions: Not present. Vag. Bleeding: None.  Movement: Present. Denies leaking of fluid.   The following portions of the patient's history were reviewed and updated as appropriate: allergies, current medications, past family history, past medical history, past social history, past surgical history and problem list. Problem list updated.  Objective:   Filed Vitals:   01/17/16 1415  BP: 119/77  Pulse: 85  Weight: 172 lb (78.019 kg)    Fetal Status: Fetal Heart Rate (bpm): 162   Movement: Present     General:  Alert, oriented and cooperative. Patient is in no acute distress.  Skin: Skin is warm and dry. No rash noted.   Cardiovascular: Normal heart rate noted  Respiratory: Normal respiratory effort, no problems with respiration noted  Abdomen: Soft, gravid, appropriate for gestational age. Pain/Pressure: Absent     Pelvic: Vag. Bleeding: None Vag D/C Character: Thin   Cervical exam performed Dilation: Fingertip Effacement (%): 30 Station: -3  Extremities: Normal range of motion.  Edema: None  Mental Status: Normal mood and affect. Normal behavior. Normal judgment and thought content.   Urinalysis: Urine Protein: Negative Urine Glucose: Negative  Assessment and Plan:  Pregnancy: G4P1021 at [redacted]w[redacted]d  1. Prenatal care, unspecified trimester FHT normal, FH normal.  Discussed NICU tour. - Culture, beta strep (group b only) - GC/Chlamydia probe amp ([place])not at [facility]  2. Supervision of high risk pregnancy, antepartum,  third trimester  3. Pregnancy complicated by subutex maintenance, antepartum (HCC)   Preterm labor symptoms and general obstetric precautions including but not limited to vaginal bleeding, contractions, leaking of fluid and fetal movement were reviewed in detail with the patient. Please refer to After Visit Summary for other counseling recommendations.  No Follow-up on file.   Levie Heritage, DO

## 2016-01-17 NOTE — Progress Notes (Signed)
Patient states that she ran out of cell minutes and mother is present for visit. Patients mother confirms she gave Nazaret the message and Azharia assures she will call [facility] [employer] nurse of NICU today and schedule NICU tour. Armandina Stammer RN BSN

## 2016-01-19 ENCOUNTER — Encounter (HOSPITAL_COMMUNITY): Payer: Self-pay

## 2016-01-19 LAB — GC/CHLAMYDIA PROBE AMP (~~LOC~~) NOT AT ARMC
CHLAMYDIA, DNA PROBE: NEGATIVE
NEISSERIA GONORRHEA: NEGATIVE

## 2016-01-19 LAB — CULTURE, BETA STREP (GROUP B ONLY)

## 2016-01-19 NOTE — Progress Notes (Signed)
NICU NAS tour scheduled for 01/24/16 at 3:30.

## 2016-01-22 ENCOUNTER — Encounter: Payer: Medicaid Other | Admitting: Advanced Practice Midwife

## 2016-01-24 ENCOUNTER — Encounter (HOSPITAL_COMMUNITY): Payer: Self-pay

## 2016-01-24 ENCOUNTER — Ambulatory Visit (INDEPENDENT_AMBULATORY_CARE_PROVIDER_SITE_OTHER): Payer: Medicaid Other | Admitting: Family Medicine

## 2016-01-24 VITALS — BP 112/73 | HR 76 | Wt 173.0 lb

## 2016-01-24 DIAGNOSIS — F1120 Opioid dependence, uncomplicated: Secondary | ICD-10-CM | POA: Diagnosis not present

## 2016-01-24 DIAGNOSIS — O0993 Supervision of high risk pregnancy, unspecified, third trimester: Secondary | ICD-10-CM | POA: Diagnosis not present

## 2016-01-24 DIAGNOSIS — O99320 Drug use complicating pregnancy, unspecified trimester: Secondary | ICD-10-CM | POA: Diagnosis not present

## 2016-01-24 DIAGNOSIS — Z79891 Long term (current) use of opiate analgesic: Secondary | ICD-10-CM | POA: Diagnosis not present

## 2016-01-24 NOTE — Progress Notes (Signed)
Subjective:  Olivia Alvarez is a 35 y.o. [phone] at [redacted]w[redacted]d being seen today for ongoing prenatal care.  She is currently monitored for the following issues for this high-risk pregnancy and has ADVERSE DRUG REACTION, SULFA; Supervision of high risk pregnancy, antepartum; Pregnancy complicated by subutex maintenance, antepartum (HCC); Rh negative state in antepartum period; and Anxiety and depression on her problem list.  Patient reports no complaints.  Contractions: Not present. Vag. Bleeding: None.  Movement: Present. Denies leaking of fluid.   The following portions of the patient's history were reviewed and updated as appropriate: allergies, current medications, past family history, past medical history, past social history, past surgical history and problem list. Problem list updated.  Objective:   Filed Vitals:   01/24/16 1416  BP: 112/73  Pulse: 76  Weight: 173 lb (78.472 kg)    Fetal Status: Fetal Heart Rate (bpm): 144   Movement: Present     General:  Alert, oriented and cooperative. Patient is in no acute distress.  Skin: Skin is warm and dry. No rash noted.   Cardiovascular: Normal heart rate noted  Respiratory: Normal respiratory effort, no problems with respiration noted  Abdomen: Soft, gravid, appropriate for gestational age. Pain/Pressure: Present     Pelvic: Vag. Bleeding: None Vag D/C Character: Thin   Cervical exam deferred        Extremities: Normal range of motion.  Edema: None  Mental Status: Normal mood and affect. Normal behavior. Normal judgment and thought content.   Urinalysis: Urine Protein: Negative Urine Glucose: Negative  Assessment and Plan:  Pregnancy: G4P1021 at [redacted]w[redacted]d  1. Supervision of high risk pregnancy, antepartum, third trimester FHT, FH normal  2. Pregnancy complicated by subutex maintenance, antepartum (HCC) Continue suboxone.  Has tour of NICU today.  Term labor symptoms and general obstetric precautions including but not  limited to vaginal bleeding, contractions, leaking of fluid and fetal movement were reviewed in detail with the patient. Please refer to After Visit Summary for other counseling recommendations.  Return in about 1 week (around 01/31/2016).   Levie Heritage, DO

## 2016-01-24 NOTE — Progress Notes (Signed)
NICU NAS tour completed by Ree Edman, NNP. Brochure given and questions answered.

## 2016-01-24 NOTE — Patient Instructions (Signed)

## 2016-01-31 ENCOUNTER — Encounter: Payer: Medicaid Other | Admitting: Family Medicine

## 2016-01-31 ENCOUNTER — Ambulatory Visit (INDEPENDENT_AMBULATORY_CARE_PROVIDER_SITE_OTHER): Payer: Medicaid Other | Admitting: Family Medicine

## 2016-01-31 VITALS — BP 114/78 | HR 80 | Wt 174.0 lb

## 2016-01-31 DIAGNOSIS — Z79891 Long term (current) use of opiate analgesic: Secondary | ICD-10-CM

## 2016-01-31 DIAGNOSIS — O0993 Supervision of high risk pregnancy, unspecified, third trimester: Secondary | ICD-10-CM

## 2016-01-31 DIAGNOSIS — O99320 Drug use complicating pregnancy, unspecified trimester: Secondary | ICD-10-CM

## 2016-01-31 DIAGNOSIS — F1120 Opioid dependence, uncomplicated: Secondary | ICD-10-CM

## 2016-01-31 NOTE — Progress Notes (Signed)
Subjective:  Olivia Alvarez is a 35 y.o. [phone] at [redacted]w[redacted]d being seen today for ongoing prenatal care.  She is currently monitored for the following issues for this high-risk pregnancy and has ADVERSE DRUG REACTION, SULFA; Supervision of high risk pregnancy, antepartum; Pregnancy complicated by subutex maintenance, antepartum (HCC); Rh negative state in antepartum period; and Anxiety and depression on her problem list.  Patient reports no complaints.  Contractions: Irregular. Vag. Bleeding: None.  Movement: Present. Denies leaking of fluid.   The following portions of the patient's history were reviewed and updated as appropriate: allergies, current medications, past family history, past medical history, past social history, past surgical history and problem list. Problem list updated.  Objective:   Filed Vitals:   01/31/16 1406  BP: 114/78  Pulse: 80  Weight: 174 lb (78.926 kg)    Fetal Status: Fetal Heart Rate (bpm): 137   Movement: Present     General:  Alert, oriented and cooperative. Patient is in no acute distress.  Skin: Skin is warm and dry. No rash noted.   Cardiovascular: Normal heart rate noted  Respiratory: Normal respiratory effort, no problems with respiration noted  Abdomen: Soft, gravid, appropriate for gestational age. Pain/Pressure: Present     Pelvic: Vag. Bleeding: None Vag D/C Character: Thin   Cervical exam deferred        Extremities: Normal range of motion.  Edema: None  Mental Status: Normal mood and affect. Normal behavior. Normal judgment and thought content.   Urinalysis: Urine Protein: Negative Urine Glucose: Negative  Assessment and Plan:  Pregnancy: G4P1021 at [redacted]w[redacted]d  1. Supervision of high risk pregnancy, antepartum, third trimester FHT normal, FH normal  2. Pregnancy complicated by subutex maintenance, antepartum (HCC) No complications   Term labor symptoms and general obstetric precautions including but not limited to vaginal  bleeding, contractions, leaking of fluid and fetal movement were reviewed in detail with the patient. Please refer to After Visit Summary for other counseling recommendations.  Return in about 1 week (around 02/07/2016) for OB f/u.   Levie Heritage, DO

## 2016-02-08 ENCOUNTER — Ambulatory Visit (INDEPENDENT_AMBULATORY_CARE_PROVIDER_SITE_OTHER): Payer: Medicaid Other | Admitting: Family Medicine

## 2016-02-08 VITALS — BP 114/79 | HR 71 | Wt 175.0 lb

## 2016-02-08 DIAGNOSIS — Z79891 Long term (current) use of opiate analgesic: Secondary | ICD-10-CM

## 2016-02-08 DIAGNOSIS — O99320 Drug use complicating pregnancy, unspecified trimester: Secondary | ICD-10-CM

## 2016-02-08 DIAGNOSIS — O0993 Supervision of high risk pregnancy, unspecified, third trimester: Secondary | ICD-10-CM

## 2016-02-08 DIAGNOSIS — F1120 Opioid dependence, uncomplicated: Secondary | ICD-10-CM

## 2016-02-08 NOTE — Progress Notes (Signed)
Subjective:  Olivia Alvarez is a 35 y.o. [phone] at [redacted]w[redacted]d being seen today for ongoing prenatal care.  She is currently monitored for the following issues for this high-risk pregnancy and has ADVERSE DRUG REACTION, SULFA; Supervision of high risk pregnancy, antepartum; Pregnancy complicated by subutex maintenance, antepartum (HCC); Rh negative state in antepartum period; and Anxiety and depression on her problem list.  Patient reports no complaints.  Contractions: Irritability. Vag. Bleeding: None.  Movement: Present. Denies leaking of fluid.   The following portions of the patient's history were reviewed and updated as appropriate: allergies, current medications, past family history, past medical history, past social history, past surgical history and problem list. Problem list updated.  Objective:   Filed Vitals:   02/08/16 1021  BP: 114/79  Pulse: 71  Weight: 175 lb (79.379 kg)    Fetal Status: Fetal Heart Rate (bpm): 120   Movement: Present  Presentation: Vertex  General:  Alert, oriented and cooperative. Patient is in no acute distress.  Skin: Skin is warm and dry. No rash noted.   Cardiovascular: Normal heart rate noted  Respiratory: Normal respiratory effort, no problems with respiration noted  Abdomen: Soft, gravid, appropriate for gestational age. Pain/Pressure: Present     Pelvic: Vag. Bleeding: None Vag D/C Character: Thin   Cervical exam performed Dilation: 1.5 Effacement (%): 50 Station: -3  Extremities: Normal range of motion.  Edema: Trace  Mental Status: Normal mood and affect. Normal behavior. Normal judgment and thought content.   Urinalysis: Urine Protein: Negative Urine Glucose: Negative  Assessment and Plan:  Pregnancy: G4P1021 at [redacted]w[redacted]d  1. Supervision of high risk pregnancy, antepartum, third trimester FHT and FH normal.  2. Pregnancy complicated by subutex maintenance, antepartum (HCC) Stable  There are no diagnoses linked to this  encounter. Term labor symptoms and general obstetric precautions including but not limited to vaginal bleeding, contractions, leaking of fluid and fetal movement were reviewed in detail with the patient. Please refer to After Visit Summary for other counseling recommendations.  Return in about 1 week (around 02/15/2016) for OB f/u.   Levie Heritage, DO

## 2016-02-08 NOTE — Patient Instructions (Signed)

## 2016-02-12 ENCOUNTER — Encounter (HOSPITAL_COMMUNITY): Payer: Self-pay | Admitting: *Deleted

## 2016-02-12 ENCOUNTER — Inpatient Hospital Stay (HOSPITAL_COMMUNITY)
Admission: EM | Admit: 2016-02-12 | Discharge: 2016-02-13 | Disposition: A | Discharge status: Home / Self Care | Payer: Medicaid Other | Source: Ambulatory Visit | Attending: Family Medicine | Admitting: Family Medicine

## 2016-02-12 DIAGNOSIS — Z3A40 40 weeks gestation of pregnancy: Secondary | ICD-10-CM | POA: Insufficient documentation

## 2016-02-12 NOTE — MAU Note (Signed)
PT  SAYS   STARTED HURTING    BAD    AT 630PM.     [facility]-     1-2 CM  LAST WEEK.      DENIES HSV AND  MRSA.       GBS-  UNSURE.

## 2016-02-12 NOTE — MAU Note (Signed)
Pt presents with complaint of contractions, denies bleeding or ROM 

## 2016-02-12 NOTE — Progress Notes (Signed)
Orders to D/C home with precautions

## 2016-02-14 ENCOUNTER — Encounter (HOSPITAL_COMMUNITY): Payer: Self-pay | Admitting: *Deleted

## 2016-02-14 ENCOUNTER — Inpatient Hospital Stay (HOSPITAL_COMMUNITY)
Admission: AD | Admit: 2016-02-14 | Discharge: 2016-02-17 | DRG: 767 | Disposition: A | Discharge status: Home / Self Care | Payer: Medicaid Other | Source: Ambulatory Visit | Attending: Obstetrics & Gynecology | Admitting: Obstetrics & Gynecology

## 2016-02-14 ENCOUNTER — Ambulatory Visit (INDEPENDENT_AMBULATORY_CARE_PROVIDER_SITE_OTHER): Payer: Medicaid Other | Admitting: Family Medicine

## 2016-02-14 VITALS — BP 117/74 | HR 82 | Wt 176.0 lb

## 2016-02-14 DIAGNOSIS — O360130 Maternal care for anti-D [Rh] antibodies, third trimester, not applicable or unspecified: Secondary | ICD-10-CM | POA: Diagnosis not present

## 2016-02-14 DIAGNOSIS — Z3A40 40 weeks gestation of pregnancy: Secondary | ICD-10-CM | POA: Diagnosis not present

## 2016-02-14 DIAGNOSIS — Z833 Family history of diabetes mellitus: Secondary | ICD-10-CM

## 2016-02-14 DIAGNOSIS — O99824 Streptococcus B carrier state complicating childbirth: Secondary | ICD-10-CM | POA: Diagnosis present

## 2016-02-14 DIAGNOSIS — IMO0001 Reserved for inherently not codable concepts without codable children: Secondary | ICD-10-CM

## 2016-02-14 DIAGNOSIS — Z8249 Family history of ischemic heart disease and other diseases of the circulatory system: Secondary | ICD-10-CM

## 2016-02-14 DIAGNOSIS — F1120 Opioid dependence, uncomplicated: Secondary | ICD-10-CM

## 2016-02-14 DIAGNOSIS — Z79891 Long term (current) use of opiate analgesic: Secondary | ICD-10-CM

## 2016-02-14 DIAGNOSIS — Z302 Encounter for sterilization: Secondary | ICD-10-CM

## 2016-02-14 DIAGNOSIS — O0993 Supervision of high risk pregnancy, unspecified, third trimester: Secondary | ICD-10-CM | POA: Diagnosis not present

## 2016-02-14 DIAGNOSIS — O99320 Drug use complicating pregnancy, unspecified trimester: Secondary | ICD-10-CM

## 2016-02-14 HISTORY — DX: Depression, unspecified: F32.A

## 2016-02-14 HISTORY — DX: Anxiety disorder, unspecified: F41.9

## 2016-02-14 HISTORY — DX: Major depressive disorder, single episode, unspecified: F32.9

## 2016-02-14 LAB — CBC
HEMATOCRIT: 33.1 % — AB (ref 36.0–46.0)
Hemoglobin: 10.7 g/dL — ABNORMAL LOW (ref 12.0–15.0)
MCH: 25.9 pg — AB (ref 26.0–34.0)
MCHC: 32.3 g/dL (ref 30.0–36.0)
MCV: 80.1 fL (ref 78.0–100.0)
PLATELETS: 245 10*3/uL (ref 150–400)
RBC: 4.13 MIL/uL (ref 3.87–5.11)
RDW: 13.5 % (ref 11.5–15.5)
WBC: 9.3 10*3/uL (ref 4.0–10.5)

## 2016-02-14 MED ORDER — PENICILLIN G POTASSIUM 5000000 UNITS IJ SOLR
2.5000 10*6.[IU] | INTRAVENOUS | Status: DC
  Administered 2016-02-15: 2.5 10*6.[IU] via INTRAVENOUS
  Filled 2016-02-14 (×4): qty 2.5

## 2016-02-14 MED ORDER — ACETAMINOPHEN 325 MG PO TABS: 650.0000 mg | ORAL_TABLET | ORAL | Status: DC | PRN

## 2016-02-14 MED ORDER — BUPRENORPHINE HCL 8 MG SL SUBL
8.0000 mg | SUBLINGUAL_TABLET | Freq: Two times a day (BID) | SUBLINGUAL | Status: DC
  Administered 2016-02-15 – 2016-02-17 (×5): 8 mg via SUBLINGUAL
  Filled 2016-02-14 (×5): qty 1

## 2016-02-14 MED ORDER — CITRIC ACID-SODIUM CITRATE 334-500 MG/5ML PO SOLN: 30.0000 mL | ORAL | Status: DC | PRN

## 2016-02-14 MED ORDER — LIDOCAINE HCL (PF) 1 % IJ SOLN
30.0000 mL | INTRAMUSCULAR | Status: DC | PRN
  Filled 2016-02-14: qty 30

## 2016-02-14 MED ORDER — PENICILLIN G POTASSIUM 5000000 UNITS IJ SOLR
5.0000 10*6.[IU] | Freq: Once | INTRAVENOUS | Status: AC
  Administered 2016-02-15: 5 10*6.[IU] via INTRAVENOUS
  Filled 2016-02-14: qty 5

## 2016-02-14 MED ORDER — LACTATED RINGERS IV SOLN: 500.0000 mL | INTRAVENOUS | Status: DC | PRN

## 2016-02-14 MED ORDER — LACTATED RINGERS IV SOLN
INTRAVENOUS | Status: DC
  Administered 2016-02-15 (×3): via INTRAVENOUS

## 2016-02-14 MED ORDER — OXYTOCIN 10 UNIT/ML IJ SOLN
2.5000 [IU]/h | INTRAVENOUS | Status: DC
  Administered 2016-02-15: 39.96 [IU]/h via INTRAVENOUS
  Filled 2016-02-14: qty 10

## 2016-02-14 MED ORDER — OXYCODONE-ACETAMINOPHEN 5-325 MG PO TABS: 2.0000 | ORAL_TABLET | ORAL | Status: DC | PRN

## 2016-02-14 MED ORDER — FENTANYL CITRATE (PF) 100 MCG/2ML IJ SOLN: 100.0000 ug | INTRAMUSCULAR | Status: DC | PRN

## 2016-02-14 MED ORDER — ONDANSETRON HCL 4 MG/2ML IJ SOLN: 4.0000 mg | Freq: Four times a day (QID) | INTRAMUSCULAR | Status: DC | PRN

## 2016-02-14 MED ORDER — OXYTOCIN BOLUS FROM INFUSION: 500.0000 mL | INTRAVENOUS | Status: DC

## 2016-02-14 MED ORDER — OXYCODONE-ACETAMINOPHEN 5-325 MG PO TABS: 1.0000 | ORAL_TABLET | ORAL | Status: DC | PRN

## 2016-02-14 NOTE — H&P (Signed)
Olivia Alvarez is a 35 y.o. female [phone] @ 40.3wks presenting for reg ctx since 1800. Denies leaking or bldg. Reports +FM. Her preg has been followed by [facility] since 29wks when tx from Dr Gaynell Face, and has been remarkable for 1) Subutex in preg  BID 2) GBS pos 3) rubella non-immune  History OB History    Gravida Para Term Preterm AB TAB SAB Ectopic Multiple Living   0 2   1     Past Medical History  Diagnosis Date  . Varicella   . Chronic back pain   . Misuse of prescription only drugs     opiates  . Vaginal Pap smear, abnormal    Past Surgical History  Procedure Laterality Date  . Right arm broken      surgery   Family History: family history includes Cancer in her father, maternal grandfather, and paternal grandfather; Diabetes in her maternal grandfather; Heart disease in her maternal grandfather and maternal grandmother. There is no history of Stroke, Mental illness, or Drug abuse. Social History:  reports that she has never smoked. She does not have any smokeless tobacco history on file. She reports that she does not drink alcohol or use illicit drugs.   Prenatal Transfer Tool  Maternal Diabetes: No Genetic Screening: Declined Maternal Ultrasounds/Referrals: Normal Fetal Ultrasounds or other Referrals:  None Maternal Substance Abuse:  Yes:  Type: Other: Subutex  BID Significant Maternal Medications:  None Significant Maternal Lab Results:  Lab values include: Group B Strep positive Other Comments:  None  ROS  Dilation: 4.5 Effacement (%): 70 Station: -3 Exam by:: E. Siska, RN Blood pressure 128/89, pulse 81, temperature 97.9 F (36.6 C), temperature source Oral, resp. rate 22, unknown if currently breastfeeding. Exam Physical Exam  Constitutional: She is oriented to person, place, and time. She appears well-developed.  HENT:  Head: Normocephalic.  Neck: Normal range of motion.  Cardiovascular: Normal rate.   Respiratory: Effort  normal.  GI:  EFM 150s, +accels, no decels Ctx irreg q 3-5 mins  Musculoskeletal: Normal range of motion.  Neurological: She is alert and oriented to person, place, and time.  Skin: Skin is warm and dry.  Psychiatric: She has a normal mood and affect. Her behavior is normal. Thought content normal.    Prenatal labs: ABO, Rh: O/NEG/-- (12/14 0001) Antibody:  neg (8/16) Rubella:  non-immune (8/16) RPR: NON REAC (12/14 0001)  HBsAg:   neg (8/16) HIV: NONREACTIVE (12/14 0001)  GBS:   POS (2/1)  Assessment/Plan: IUP@term  Early active labor GBS pos  Admit to [employer] [facility] management PCN G for GBS ppx Epidural for pain management Anticipate SVD   Olivia Alvarez, Olivia Alvarez CNM 02/14/2016, 11:31 PM

## 2016-02-14 NOTE — MAU Note (Signed)
Pt presents complaining of contractions every 3-5 minutes. Denies leaking. Some bloody show. 2cm in office today and membranes swept

## 2016-02-14 NOTE — Progress Notes (Signed)
Subjective:  Olivia Alvarez is a 35 y.o. [phone] at [redacted]w[redacted]d being seen today for ongoing prenatal care.  She is currently monitored for the following issues for this high-risk pregnancy and has ADVERSE DRUG REACTION, SULFA; Supervision of high risk pregnancy, antepartum; Pregnancy complicated by subutex maintenance, antepartum (HCC); Rh negative state in antepartum period; and Anxiety and depression on her problem list.  Patient reports no complaints.  Contractions: Irritability. Vag. Bleeding: None.  Movement: Present. Denies leaking of fluid.   The following portions of the patient's history were reviewed and updated as appropriate: allergies, current medications, past family history, past medical history, past social history, past surgical history and problem list. Problem list updated.  Objective:   Filed Vitals:   02/14/16 1420  BP: 117/74  Pulse: 82  Weight: 176 lb (79.833 kg)    Fetal Status:     Movement: Present     General:  Alert, oriented and cooperative. Patient is in no acute distress.  Skin: Skin is warm and dry. No rash noted.   Cardiovascular: Normal heart rate noted  Respiratory: Normal respiratory effort, no problems with respiration noted  Abdomen: Soft, gravid, appropriate for gestational age. Pain/Pressure: Present     Pelvic: Vag. Bleeding: None Vag D/C Character: Thin   Cervical exam performed Dilation: 2 Effacement (%): 50 Station: -3  Extremities: Normal range of motion.  Edema: Trace  Mental Status: Normal mood and affect. Normal behavior. Normal judgment and thought content.   Urinalysis: Urine Protein: Negative Urine Glucose: Negative  Assessment and Plan:  Pregnancy: G4P1021 at [redacted]w[redacted]d  1. Supervision of high risk pregnancy, antepartum, third trimester FHT normal.  Will schedule induction for postdates on Saturday to help accommodate husband's school schedule.  2. Pregnancy complicated by subutex maintenance, antepartum (HCC) Stable.  3.  Rh negative state in antepartum period, third trimester, not applicable or unspecified fetus   Term labor symptoms and general obstetric precautions including but not limited to vaginal bleeding, contractions, leaking of fluid and fetal movement were reviewed in detail with the patient. Please refer to After Visit Summary for other counseling recommendations.  No Follow-up on file.   Levie Heritage, DO

## 2016-02-15 ENCOUNTER — Encounter (HOSPITAL_COMMUNITY): Payer: Self-pay

## 2016-02-15 ENCOUNTER — Telehealth (HOSPITAL_COMMUNITY): Payer: Self-pay | Admitting: *Deleted

## 2016-02-15 ENCOUNTER — Inpatient Hospital Stay (HOSPITAL_COMMUNITY): Payer: Medicaid Other | Admitting: Anesthesiology

## 2016-02-15 ENCOUNTER — Inpatient Hospital Stay (HOSPITAL_COMMUNITY): Payer: Medicaid Other | Admitting: Certified Registered Nurse Anesthetist

## 2016-02-15 ENCOUNTER — Encounter (HOSPITAL_COMMUNITY)
Admission: AD | Disposition: A | Discharge status: Home / Self Care | Payer: Self-pay | Source: Ambulatory Visit | Attending: Obstetrics & Gynecology

## 2016-02-15 ENCOUNTER — Encounter (HOSPITAL_COMMUNITY): Payer: Self-pay | Admitting: Obstetrics & Gynecology

## 2016-02-15 DIAGNOSIS — O99824 Streptococcus B carrier state complicating childbirth: Secondary | ICD-10-CM

## 2016-02-15 DIAGNOSIS — O99323 Drug use complicating pregnancy, third trimester: Secondary | ICD-10-CM

## 2016-02-15 DIAGNOSIS — F1120 Opioid dependence, uncomplicated: Secondary | ICD-10-CM

## 2016-02-15 DIAGNOSIS — Z3A40 40 weeks gestation of pregnancy: Secondary | ICD-10-CM

## 2016-02-15 DIAGNOSIS — Z302 Encounter for sterilization: Secondary | ICD-10-CM

## 2016-02-15 HISTORY — PX: TUBAL LIGATION: SHX77

## 2016-02-15 LAB — RPR: RPR: NONREACTIVE

## 2016-02-15 SURGERY — LIGATION, FALLOPIAN TUBE, POSTPARTUM
Anesthesia: Epidural | Site: Abdomen | Laterality: Bilateral

## 2016-02-15 MED ORDER — BUPIVACAINE HCL (PF) 0.5 % IJ SOLN
INTRAMUSCULAR | Status: AC
  Filled 2016-02-15: qty 30

## 2016-02-15 MED ORDER — LACTATED RINGERS IV SOLN
INTRAVENOUS | Status: DC
  Administered 2016-02-15: 14:00:00 via INTRAVENOUS

## 2016-02-15 MED ORDER — PROPOFOL 10 MG/ML IV BOLUS
INTRAVENOUS | Status: AC
  Filled 2016-02-15: qty 40

## 2016-02-15 MED ORDER — DIPHENHYDRAMINE HCL 25 MG PO CAPS: 25.0000 mg | ORAL_CAPSULE | Freq: Four times a day (QID) | ORAL | Status: DC | PRN

## 2016-02-15 MED ORDER — DIBUCAINE 1 % RE OINT: 1.0000 "application " | TOPICAL_OINTMENT | RECTAL | Status: DC | PRN

## 2016-02-15 MED ORDER — PROPOFOL 500 MG/50ML IV EMUL
INTRAVENOUS | Status: DC | PRN
  Administered 2016-02-15: 50 ug/kg/min via INTRAVENOUS

## 2016-02-15 MED ORDER — ZOLPIDEM TARTRATE 5 MG PO TABS: 5.0000 mg | ORAL_TABLET | Freq: Every evening | ORAL | Status: DC | PRN

## 2016-02-15 MED ORDER — SENNOSIDES-DOCUSATE SODIUM 8.6-50 MG PO TABS
2.0000 | ORAL_TABLET | ORAL | Status: DC
  Administered 2016-02-16: 2 via ORAL
  Filled 2016-02-15 (×3): qty 2

## 2016-02-15 MED ORDER — METOCLOPRAMIDE HCL 10 MG PO TABS
10.0000 mg | ORAL_TABLET | Freq: Once | ORAL | Status: AC
  Administered 2016-02-15: 10 mg via ORAL
  Filled 2016-02-15: qty 1

## 2016-02-15 MED ORDER — PRENATAL MULTIVITAMIN CH
1.0000 | ORAL_TABLET | Freq: Every day | ORAL | Status: DC
  Administered 2016-02-16 – 2016-02-17 (×2): 1 via ORAL
  Filled 2016-02-15 (×2): qty 1

## 2016-02-15 MED ORDER — ACETAMINOPHEN 325 MG PO TABS: 325.0000 mg | ORAL_TABLET | ORAL | Status: DC | PRN

## 2016-02-15 MED ORDER — LIDOCAINE HCL (CARDIAC) 20 MG/ML IV SOLN
INTRAVENOUS | Status: AC
  Filled 2016-02-15: qty 5

## 2016-02-15 MED ORDER — ONDANSETRON HCL 4 MG/2ML IJ SOLN: 4.0000 mg | INTRAMUSCULAR | Status: DC | PRN

## 2016-02-15 MED ORDER — PROPOFOL 10 MG/ML IV BOLUS
INTRAVENOUS | Status: DC | PRN
  Administered 2016-02-15: 20 mg via INTRAVENOUS
  Administered 2016-02-15: 10 mg via INTRAVENOUS
  Administered 2016-02-15: 20 mg via INTRAVENOUS

## 2016-02-15 MED ORDER — LACTATED RINGERS IV SOLN
INTRAVENOUS | Status: DC
  Administered 2016-02-15 (×2): via INTRAVENOUS

## 2016-02-15 MED ORDER — FAMOTIDINE 20 MG PO TABS
40.0000 mg | ORAL_TABLET | Freq: Once | ORAL | Status: AC
  Administered 2016-02-15: 40 mg via ORAL
  Filled 2016-02-15 (×2): qty 2

## 2016-02-15 MED ORDER — MIDAZOLAM HCL 2 MG/2ML IJ SOLN
INTRAMUSCULAR | Status: AC
  Filled 2016-02-15: qty 2

## 2016-02-15 MED ORDER — FENTANYL 2.5 MCG/ML BUPIVACAINE 1/10 % EPIDURAL INFUSION (WH - ANES)
14.0000 mL/h | INTRAMUSCULAR | Status: DC | PRN
  Administered 2016-02-15: 12 mL/h via EPIDURAL
  Filled 2016-02-15: qty 125

## 2016-02-15 MED ORDER — BUPIVACAINE HCL 0.5 % IJ SOLN
INTRAMUSCULAR | Status: DC | PRN
  Administered 2016-02-15: 30 mL

## 2016-02-15 MED ORDER — ONDANSETRON HCL 4 MG PO TABS: 4.0000 mg | ORAL_TABLET | ORAL | Status: DC | PRN

## 2016-02-15 MED ORDER — DEXAMETHASONE SODIUM PHOSPHATE 4 MG/ML IJ SOLN
INTRAMUSCULAR | Status: DC | PRN
  Administered 2016-02-15: 4 mg via INTRAVENOUS

## 2016-02-15 MED ORDER — ONDANSETRON HCL 4 MG/2ML IJ SOLN
INTRAMUSCULAR | Status: AC
  Filled 2016-02-15: qty 2

## 2016-02-15 MED ORDER — TETANUS-DIPHTH-ACELL PERTUSSIS 5-2.5-18.5 LF-MCG/0.5 IM SUSP: 0.5000 mL | Freq: Once | INTRAMUSCULAR | Status: DC

## 2016-02-15 MED ORDER — ACETAMINOPHEN 325 MG PO TABS
650.0000 mg | ORAL_TABLET | ORAL | Status: DC | PRN
  Administered 2016-02-15: 650 mg via ORAL
  Filled 2016-02-15: qty 2

## 2016-02-15 MED ORDER — LANOLIN HYDROUS EX OINT
TOPICAL_OINTMENT | CUTANEOUS | Status: DC | PRN
Start: 2016-02-15 — End: 2016-02-17

## 2016-02-15 MED ORDER — HYDROMORPHONE HCL 1 MG/ML IJ SOLN
0.2500 mg | INTRAMUSCULAR | Status: DC | PRN
Start: 2016-02-15 — End: 2016-02-17

## 2016-02-15 MED ORDER — LIDOCAINE HCL (PF) 1 % IJ SOLN
INTRAMUSCULAR | Status: DC | PRN
  Administered 2016-02-15 (×2): 4 mL

## 2016-02-15 MED ORDER — OXYCODONE HCL 5 MG PO TABS
5.0000 mg | ORAL_TABLET | Freq: Once | ORAL | Status: AC | PRN
  Administered 2016-02-15: 5 mg via ORAL
  Filled 2016-02-15: qty 1

## 2016-02-15 MED ORDER — OXYCODONE HCL 5 MG/5ML PO SOLN: 5.0000 mg | Freq: Once | ORAL | Status: AC | PRN

## 2016-02-15 MED ORDER — SODIUM BICARBONATE 8.4 % IV SOLN
INTRAVENOUS | Status: DC | PRN
  Administered 2016-02-15 (×3): 5 mL via EPIDURAL

## 2016-02-15 MED ORDER — LACTATED RINGERS IV SOLN
500.0000 mL | Freq: Once | INTRAVENOUS | Status: AC
  Administered 2016-02-15: 500 mL via INTRAVENOUS

## 2016-02-15 MED ORDER — BENZOCAINE-MENTHOL 20-0.5 % EX AERO
1.0000 "application " | INHALATION_SPRAY | CUTANEOUS | Status: DC | PRN
  Administered 2016-02-15: 1 via TOPICAL
  Filled 2016-02-15: qty 56

## 2016-02-15 MED ORDER — EPHEDRINE 5 MG/ML INJ
10.0000 mg | INTRAVENOUS | Status: DC | PRN
  Filled 2016-02-15: qty 2

## 2016-02-15 MED ORDER — LIDOCAINE-EPINEPHRINE (PF) 2 %-1:200000 IJ SOLN
INTRAMUSCULAR | Status: AC
  Filled 2016-02-15: qty 20

## 2016-02-15 MED ORDER — IBUPROFEN 600 MG PO TABS
600.0000 mg | ORAL_TABLET | Freq: Four times a day (QID) | ORAL | Status: DC
  Administered 2016-02-15 – 2016-02-17 (×9): 600 mg via ORAL
  Filled 2016-02-15 (×10): qty 1

## 2016-02-15 MED ORDER — WITCH HAZEL-GLYCERIN EX PADS: 1.0000 "application " | MEDICATED_PAD | CUTANEOUS | Status: DC | PRN

## 2016-02-15 MED ORDER — DIPHENHYDRAMINE HCL 50 MG/ML IJ SOLN: 12.5000 mg | INTRAMUSCULAR | Status: DC | PRN

## 2016-02-15 MED ORDER — PHENYLEPHRINE 40 MCG/ML (10ML) SYRINGE FOR IV PUSH (FOR BLOOD PRESSURE SUPPORT)
80.0000 ug | PREFILLED_SYRINGE | INTRAVENOUS | Status: DC | PRN
  Administered 2016-02-15: 80 ug via INTRAVENOUS
  Filled 2016-02-15: qty 2

## 2016-02-15 MED ORDER — ONDANSETRON HCL 4 MG/2ML IJ SOLN
INTRAMUSCULAR | Status: DC | PRN
  Administered 2016-02-15: 4 mg via INTRAVENOUS

## 2016-02-15 MED ORDER — MIDAZOLAM HCL 2 MG/2ML IJ SOLN
INTRAMUSCULAR | Status: DC | PRN
  Administered 2016-02-15: 2 mg via INTRAVENOUS

## 2016-02-15 MED ORDER — LIDOCAINE HCL (CARDIAC) 20 MG/ML IV SOLN
INTRAVENOUS | Status: DC | PRN
  Administered 2016-02-15: 80 mg via INTRATRACHEAL

## 2016-02-15 MED ORDER — ACETAMINOPHEN 160 MG/5ML PO SOLN: 325.0000 mg | ORAL | Status: DC | PRN

## 2016-02-15 MED ORDER — SIMETHICONE 80 MG PO CHEW: 80.0000 mg | CHEWABLE_TABLET | ORAL | Status: DC | PRN

## 2016-02-15 MED ORDER — DEXAMETHASONE SODIUM PHOSPHATE 10 MG/ML IJ SOLN
INTRAMUSCULAR | Status: AC
  Filled 2016-02-15: qty 1

## 2016-02-15 MED ORDER — SODIUM BICARBONATE 8.4 % IV SOLN
INTRAVENOUS | Status: AC
  Filled 2016-02-15: qty 50

## 2016-02-15 MED ORDER — PHENYLEPHRINE 40 MCG/ML (10ML) SYRINGE FOR IV PUSH (FOR BLOOD PRESSURE SUPPORT)
80.0000 ug | PREFILLED_SYRINGE | INTRAVENOUS | Status: DC | PRN
  Filled 2016-02-15: qty 2
  Filled 2016-02-15: qty 20

## 2016-02-15 SURGICAL SUPPLY — 19 items
CATH ROBINSON RED A/P 16FR (CATHETERS) ×3 IMPLANT
CHLORAPREP W/TINT 26ML (MISCELLANEOUS) ×3 IMPLANT
CLIP FILSHIE TUBAL LIGA STRL (Clip) ×6 IMPLANT
CLOTH BEACON ORANGE TIMEOUT ST (SAFETY) ×3 IMPLANT
DRSG OPSITE POSTOP 3X4 (GAUZE/BANDAGES/DRESSINGS) ×3 IMPLANT
GLOVE BIOGEL PI IND STRL 7.0 (GLOVE) ×3 IMPLANT
GLOVE BIOGEL PI INDICATOR 7.0 (GLOVE) ×6
GLOVE ECLIPSE 7.0 STRL STRAW (GLOVE) ×3 IMPLANT
GOWN STRL REUS W/TWL LRG LVL3 (GOWN DISPOSABLE) ×6 IMPLANT
GOWN STRL REUS W/TWL XL LVL3 (GOWN DISPOSABLE) ×3 IMPLANT
NEEDLE HYPO 22GX1.5 SAFETY (NEEDLE) IMPLANT
NS IRRIG 1000ML POUR BTL (IV SOLUTION) ×3 IMPLANT
PACK ABDOMINAL MINOR (CUSTOM PROCEDURE TRAY) ×3 IMPLANT
SUT VIC AB 0 CT1 27 (SUTURE) ×3
SUT VIC AB 0 CT1 27XBRD ANBCTR (SUTURE) ×1 IMPLANT
SUT VIC AB 4-0 PS2 27 (SUTURE) ×3 IMPLANT
SYR CONTROL 10ML LL (SYRINGE) IMPLANT
TOWEL OR 17X24 6PK STRL BLUE (TOWEL DISPOSABLE) ×6 IMPLANT
WATER STERILE IRR 1000ML POUR (IV SOLUTION) ×3 IMPLANT

## 2016-02-15 NOTE — Op Note (Signed)
Olivia Alvarez 02/14/2016 - 02/15/2016  PREOPERATIVE DIAGNOSIS:  Multiparity, undesired fertility  POSTOPERATIVE DIAGNOSIS:  Multiparity, undesired fertility  PROCEDURE:  Postpartum Bilateral Tubal Sterilization using Filshie Clips   ANESTHESIA:  Epidural and local analgesia using 0.5% Marcaine  COMPLICATIONS:  None immediate.  ESTIMATED BLOOD LOSS: 5 ml.  URINE OUTPUT:  Catheter placed post op- clear urine.  INDICATIONS: 35 y.o. G4W1027  with undesired fertility,status post vaginal delivery, desires permanent sterilization.  Other reversible forms of contraception were discussed with patient; she declines all other modalities. Risks of procedure discussed with patient including but not limited to: risk of regret, permanence of method, bleeding, infection, injury to surrounding organs and need for additional procedures.  Failure risk of 0.5-1% with increased risk of ectopic gestation if pregnancy occurs was also discussed with patient.     FINDINGS:  Normal uterus, tubes, and ovaries.  PROCEDURE DETAILS: The patient was taken to the operating room where her epidural anesthesia was dosed up to surgical level and found to be adequate.  She was then placed in the dorsal supine position and prepped and draped in sterile fashion.  After an adequate timeout was performed, attention was turned to the patient's abdomen where a small transverse skin incision was made under the umbilical fold. The incision was taken down to the layer of fascia using the scalpel, and fascia was incised, and extended bilaterally using Mayo scissors. The peritoneum was entered in a sharp fashion. Attention was then turned to the patient's uterus, and left fallopian tube was identified and followed out to the fimbriated end.  A Filshie clip was placed on the left fallopian tube about 3 cm from the cornual attachment, with care given to incorporate the underlying mesosalpinx.  A similar process was carried out on the  right side allowing for bilateral tubal sterilization.  Good hemostasis was noted overall.  The instruments were then removed from the patient's abdomen and the fascial incision was repaired with 0 Vicryl, and the skin was closed with a 4-0 Vicryl subcuticular stitch. The patient tolerated the procedure well.  Instrument, sponge, and needle counts were correct times two.  The patient was then taken to the recovery room awake and in stable condition.  Micky Sheller L. Harraway-Smith, M.D., [facility]

## 2016-02-15 NOTE — Lactation Note (Addendum)
This note was copied from a baby's chart. Lactation Consultation Note  Mother in surgery for tubal.  Recently received formula while mother was in surgery. FOB states mother wants to breastfeed.  Told family LC will follow up later today. Had difficulty with latching first baby. FOB made aware of O/P services, breastfeeding support groups, community resources, and our phone # for post-discharge questions.    Patient Name: Olivia Alvarez [id number] Date: 02/15/2016     Maternal Data    Feeding Feeding Type: Formula Nipple Type: Slow - flow  LATCH Score/Interventions                      Lactation Tools Discussed/Used     Consult Status      [name] 02/15/2016, 11:15 AM

## 2016-02-15 NOTE — Telephone Encounter (Signed)
Preadmission screen  

## 2016-02-15 NOTE — H&P (Signed)
Patient desires surgical management with postpartum tubal ligation.  The risks of surgery were discussed in detail with the patient including but not limited to: bleeding which may require transfusion or reoperation; infection which may require prolonged hospitalization or re-hospitalization and antibiotic therapy; injury to bowel, bladder, ureters and major vessels or other surrounding organs; need for additional procedures including laparotomy; thromboembolic phenomenon, incisional problems and other postoperative or anesthesia complications.  Patient was told that the likelihood that her condition and symptoms will be treated effectively with this surgical management was very high; the postoperative expectations were also discussed in detail. The patient also understands the alternative treatment options which were discussed in full. All questions were answered.    Sharon Rubis L. Harraway-Smith, M.D., [facility]

## 2016-02-15 NOTE — Anesthesia Preprocedure Evaluation (Signed)
Anesthesia Evaluation  Patient identified by MRN, date of birth, ID band Patient awake    Reviewed: Allergy & Precautions, NPO status , Patient's Chart, lab work & pertinent test results  History of Anesthesia Complications Negative for: history of anesthetic complications  Airway Mallampati: II  TM Distance: >3 FB Neck ROM: Full    Dental no notable dental hx. (+) Dental Advisory Given   Pulmonary neg pulmonary ROS,    Pulmonary exam normal breath sounds clear to auscultation       Cardiovascular negative cardio ROS Normal cardiovascular exam Rhythm:Regular Rate:Normal     Neuro/Psych PSYCHIATRIC DISORDERS Anxiety Depression negative neurological ROS     GI/Hepatic negative GI ROS, (+)     substance abuse  , Hx of opioid abuse, currently on suboxone therapy   Endo/Other  negative endocrine ROS  Renal/GU negative Renal ROS  negative genitourinary   Musculoskeletal negative musculoskeletal ROS (+)   Abdominal   Peds negative pediatric ROS (+)  Hematology negative hematology ROS (+)   Anesthesia Other Findings   Reproductive/Obstetrics (+) Pregnancy                             Anesthesia Physical Anesthesia Plan  ASA: II  Anesthesia Plan: Epidural   Post-op Pain Management:    Induction:   Airway Management Planned:   Additional Equipment:   Intra-op Plan:   Post-operative Plan:   Informed Consent: I have reviewed the patients History and Physical, chart, labs and discussed the procedure including the risks, benefits and alternatives for the proposed anesthesia with the patient or authorized representative who has indicated his/her understanding and acceptance.   Dental advisory given  Plan Discussed with: CRNA  Anesthesia Plan Comments:         Anesthesia Quick Evaluation

## 2016-02-15 NOTE — Transfer of Care (Signed)
Immediate Anesthesia Transfer of Care Note  Patient: Olivia Alvarez  Procedure(s) Performed: Procedure(s): POST PARTUM TUBAL LIGATION (Bilateral)  Patient Location: PACU  Anesthesia Type:Epidural  Level of Consciousness: awake, alert , oriented and patient cooperative  Airway & Oxygen Therapy: Patient Spontanous Breathing and Patient connected to nasal cannula oxygen  Post-op Assessment: Report given to RN and Post -op Vital signs reviewed and stable  Post vital signs: Reviewed and stable  Last Vitals:  Filed Vitals:   02/15/16 0840 02/15/16 0940  BP: 118/78 126/80  Pulse: 79 74  Temp: 36.8 C 36.6 C  Resp: 18 18    Complications: No apparent anesthesia complications

## 2016-02-15 NOTE — Anesthesia Preprocedure Evaluation (Signed)
Anesthesia Evaluation  Patient identified by MRN, date of birth, ID band Patient awake    Reviewed: Allergy & Precautions, NPO status , Patient's Chart, lab work & pertinent test results  History of Anesthesia Complications Negative for: history of anesthetic complications  Airway Mallampati: II  TM Distance: >3 FB Neck ROM: Full    Dental no notable dental hx. (+) Dental Advisory Given   Pulmonary neg pulmonary ROS,    Pulmonary exam normal breath sounds clear to auscultation       Cardiovascular negative cardio ROS Normal cardiovascular exam Rhythm:Regular Rate:Normal     Neuro/Psych PSYCHIATRIC DISORDERS Anxiety Depression negative neurological ROS     GI/Hepatic negative GI ROS, (+)     substance abuse  , Hx of opioid abuse, currently on suboxone therapy   Endo/Other  negative endocrine ROS  Renal/GU negative Renal ROS  negative genitourinary   Musculoskeletal negative musculoskeletal ROS (+)   Abdominal   Peds negative pediatric ROS (+)  Hematology negative hematology ROS (+)   Anesthesia Other Findings Post partum with epidural  Reproductive/Obstetrics                             Anesthesia Physical Anesthesia Plan  ASA: II  Anesthesia Plan: Epidural   Post-op Pain Management:    Induction:   Airway Management Planned: Nasal Cannula, Simple Face Mask and Natural Airway  Additional Equipment: None  Intra-op Plan:   Post-operative Plan:   Informed Consent: I have reviewed the patients History and Physical, chart, labs and discussed the procedure including the risks, benefits and alternatives for the proposed anesthesia with the patient or authorized representative who has indicated his/her understanding and acceptance.   Dental advisory given  Plan Discussed with: CRNA and Surgeon  Anesthesia Plan Comments:         Anesthesia Quick Evaluation

## 2016-02-15 NOTE — Anesthesia Postprocedure Evaluation (Signed)
Anesthesia Post Note  Patient: Olivia Alvarez  Procedure(s) Performed: Procedure(s) (LRB): POST PARTUM TUBAL LIGATION (Bilateral)  Patient location during evaluation: Mother Baby Anesthesia Type: Epidural Level of consciousness: awake, awake and alert and oriented Pain management: pain level controlled Vital Signs Assessment: post-procedure vital signs reviewed and stable Respiratory status: spontaneous breathing Cardiovascular status: stable Postop Assessment: no signs of nausea or vomiting, adequate PO intake and patient able to bend at knees Anesthetic complications: no    Last Vitals:  Filed Vitals:   02/15/16 1307 02/15/16 1418  BP: 116/76 115/79  Pulse: 70 76  Temp: 36.9 C 36.7 C  Resp: 16 18    Last Pain:  Filed Vitals:   02/15/16 1510  PainSc: 5                  Reyes Fifield Hristova

## 2016-02-15 NOTE — Anesthesia Procedure Notes (Signed)
Epidural Patient location during procedure: OB  Staffing Anesthesiologist: Alvaretta Eisenberger Performed by: anesthesiologist   Preanesthetic Checklist Completed: patient identified, site marked, surgical consent, pre-op evaluation, timeout performed, IV checked, risks and benefits discussed and monitors and equipment checked  Epidural Patient position: sitting Prep: site prepped and draped and DuraPrep Patient monitoring: continuous pulse ox and blood pressure Approach: midline Location: L3-L4 Injection technique: LOR saline  Needle:  Needle type: Tuohy  Needle gauge: 17 G Needle length: 9 cm and 9 Needle insertion depth: 7 cm Catheter type: closed end flexible Catheter size: 19 Gauge Catheter at skin depth: 11 cm Test dose: negative  Assessment Events: blood not aspirated, injection not painful, no injection resistance, negative IV test and no paresthesia  Additional Notes Patient identified. Risks/Benefits/Options discussed with patient including but not limited to bleeding, infection, nerve damage, paralysis, failed block, incomplete pain control, headache, blood pressure changes, nausea, vomiting, reactions to medication both or allergic, itching and postpartum back pain. Confirmed with bedside nurse the patient's most recent platelet count. Confirmed with patient that they are not currently taking any anticoagulation, have any bleeding history or any family history of bleeding disorders. Patient expressed understanding and wished to proceed. All questions were answered. Sterile technique was used throughout the entire procedure. Please see nursing notes for vital signs. Test dose was given through epidural catheter and negative prior to continuing to dose epidural or start infusion. Warning signs of high block given to the patient including shortness of breath, tingling/numbness in hands, complete motor block, or any concerning symptoms with instructions to call for help. Patient was  given instructions on fall risk and not to get out of bed. All questions and concerns addressed with instructions to call with any issues or inadequate analgesia.      

## 2016-02-15 NOTE — Lactation Note (Signed)
This note was copied from a baby's chart. Lactation Consultation Note  Upon entering room baby latched in football position and mother stated "this does not feel natural and I am uncomfortable with this". Female visitor in room assisted mother w/ latching.  Baby had breastfed for approx 10 min. Offered to reposition mother to another position but mother states she was in too much pain to move. Baby unlatched and as LC was assisting mother with breastfeeding position, mother she did not want to breastfeed right now because she was hurting. Mother began wincing in pain and was unsure if she could get more pain medication.  Sent message to get RN for pain medicine. Baby was spitty while in room and then cueing.  Female visitor in room offered to give baby formula. Reviewed volume guidelines. Suggest mother call if she would like assistance with latching.    Patient Name: Girl Leafy Motsinger [id number] Date: 02/15/2016 Reason for consult: Initial assessment   Maternal Data    Feeding Feeding Type: Breast Fed Length of feed: 10 min  LATCH Score/Interventions Latch: Repeated attempts needed to sustain latch, nipple held in mouth throughout feeding, stimulation needed to elicit sucking reflex.  Audible Swallowing: None  Type of Nipple: Flat (semi flat)  Comfort (Breast/Nipple): Soft / non-tender     Hold (Positioning): Assistance needed to correctly position infant at breast and maintain latch.  LATCH Score: 5  Lactation Tools Discussed/Used     Consult Status Consult Status: Follow-up Date: 02/16/16 Follow-up type: In-patient    Dahlia Byes [facility] 02/15/2016, 2:15 PM

## 2016-02-15 NOTE — Op Note (Signed)
Olivia Alvarez 02/14/2016 - 02/15/2016  PREOPERATIVE DIAGNOSIS:  Multiparity, undesired fertility  POSTOPERATIVE DIAGNOSIS:  Multiparity, undesired fertility  PROCEDURE:  Postpartum Bilateral Tubal Sterilization using Filshie Clips   ANESTHESIA:  Epidural and local analgesia using 0.5% Marcaine  COMPLICATIONS:  None immediate.  ESTIMATED BLOOD LOSS: 5 ml.  FLUIDS:  LR.  URINE OUTPUT:  50 ml of clear urine.  SURGEON:  Surgeon(s) and Role:    * Willodean Rosenthal, MD - Primary    * Federico Flake, MD - Assisting  INDICATIONS: 35 y.o. [phone]  with undesired fertility,status post vaginal delivery, desires permanent sterilization.  Other reversible forms of contraception were discussed with patient; she declines all other modalities. Risks of procedure discussed with patient including but not limited to: risk of regret, permanence of method, bleeding, infection, injury to surrounding organs and need for additional procedures.  Failure risk of 0.5-1% with increased risk of ectopic gestation if pregnancy occurs was also discussed with patient.     FINDINGS:  Normal uterus, tubes, and ovaries.  PROCEDURE DETAILS: The patient was taken to the operating room where her epidural anesthesia was dosed up to surgical level and found to be adequate.  She was then placed in the dorsal supine position and prepped and draped in sterile fashion.  After an adequate timeout was performed, attention was turned to the patient's abdomen where a small transverse skin incision was made under the umbilical fold. The incision was taken down to the layer of fascia using the scalpel, and fascia was incised, and extended bilaterally using Mayo scissors. The peritoneum was entered in a sharp fashion. Attention was then turned to the patient's uterus, and left fallopian tube was identified and followed out to the fimbriated end.  A Filshie clip was placed on the left fallopian tube about 3 cm  from the cornual attachment, with care given to incorporate the underlying mesosalpinx.  A similar process was carried out on the right side allowing for bilateral tubal sterilization.  Good hemostasis was noted overall.  The instruments were then removed from the patient's abdomen and the fascial incision was repaired with 0 Vicryl, and the skin was closed with a 4-0 Vicryl subcuticular stitch. The patient tolerated the procedure well.  Instrument, sponge, and needle counts were correct times two.  The patient was then taken to the recovery room awake and in stable condition.  Federico Flake, MD

## 2016-02-15 NOTE — Anesthesia Postprocedure Evaluation (Signed)
Anesthesia Post Note  Patient: Olivia Alvarez  Procedure(s) Performed: * No procedures listed *  Patient location during evaluation: Mother Baby Anesthesia Type: Epidural Level of consciousness: awake, awake and alert and oriented Pain management: pain level controlled Vital Signs Assessment: post-procedure vital signs reviewed and stable Respiratory status: spontaneous breathing Cardiovascular status: stable Postop Assessment: no signs of nausea or vomiting and adequate PO intake Anesthetic complications: no    Last Vitals:  Filed Vitals:   02/15/16 1307 02/15/16 1418  BP: 116/76 115/79  Pulse: 70 76  Temp: 36.9 C 36.7 C  Resp: 16 18    Last Pain:  Filed Vitals:   02/15/16 1510  PainSc: 5                  Shaunn Tackitt Hristova

## 2016-02-15 NOTE — Brief Op Note (Signed)
02/14/2016 - 02/15/2016  7:43 PM  PATIENT:  Olivia Alvarez  35 y.o. female  PRE-OPERATIVE DIAGNOSIS:  desires sterilization   POST-OPERATIVE DIAGNOSIS:  desires sterilization   PROCEDURE:  Procedure(s): POST PARTUM TUBAL LIGATION (Bilateral)  SURGEON:  Surgeon(s) and Role:    * Willodean Rosenthal, MD - Primary    * Federico Flake, MD - Assisting  ANESTHESIA:   local and epidural  EBL:     BLOOD ADMINISTERED:none  DRAINS: none   LOCAL MEDICATIONS USED:  MARCAINE     SPECIMEN:  No Specimen  DISPOSITION OF SPECIMEN:  N/A  COUNTS:  YES  TOURNIQUET:  * No tourniquets in log *  DICTATION: .Note written in EPIC  PLAN OF CARE: Admit to inpatient   PATIENT DISPOSITION:  PACU - hemodynamically stable.   Delay start of Pharmacological VTE agent (>24hrs) due to surgical blood loss or risk of bleeding: no

## 2016-02-16 ENCOUNTER — Encounter (HOSPITAL_COMMUNITY): Payer: Self-pay | Admitting: Obstetrics & Gynecology

## 2016-02-16 MED ORDER — TRAMADOL HCL 50 MG PO TABS
100.0000 mg | ORAL_TABLET | Freq: Four times a day (QID) | ORAL | Status: DC | PRN
  Administered 2016-02-16 – 2016-02-17 (×4): 100 mg via ORAL
  Filled 2016-02-16 (×4): qty 2

## 2016-02-16 NOTE — Progress Notes (Signed)
Post Partum Day 1, post op day 1 BTL Subjective:  up ad lib, voiding and tolerating PO, small lochia, plans to breastfeed, plans to bottle feed, bilateral tubal ligation yesterday.  C/O having Right sided abdominal pain--along area of round ligaments to groin--ever since being turned onto her left side while in labor.  Felt an abrupt onset of pain at that time that has not stopped.  Requests PO narcotic for pain.  Discussed possibility of triggering cravings, pt denies that risk and requests medicine. Using heating pad.  Objective: Blood pressure 115/76, pulse 53, temperature 98.5 F (36.9 C), temperature source Oral, resp. rate 18, height 5\' 6"  (1.676 m), weight 80.74 kg (178 lb), SpO2 97 %, unknown if currently breastfeeding.  Physical Exam:  General: alert, cooperative and no distress Lochia:normal flow Chest: CTAB Heart: RRR no m/r/g Abdomen: +BS, soft, nontender except in area described above--? Musculoskeletal--middle/left side of uterus non tender.  Umbilical dressing dry/intact Uterine Fundus: firm DVT Evaluation: No evidence of DVT seen on physical exam. Extremities: trace edema   Recent Labs  02/14/16 2340  HGB 10.7*  HCT 33.1*    Assessment/Plan: Plan for discharge tomorrow and Breastfeeding; Will add tramadol to medicine regimen, try and ice pack instead of heat.    LOS: 2 days   CRESENZO-DISHMAN,Joshwa Hemric 02/16/2016, 7:54 AM

## 2016-02-16 NOTE — Progress Notes (Signed)
UR chart review completed.  

## 2016-02-17 ENCOUNTER — Encounter (HOSPITAL_COMMUNITY): Payer: Self-pay | Admitting: *Deleted

## 2016-02-17 ENCOUNTER — Inpatient Hospital Stay (HOSPITAL_COMMUNITY): Admission: RE | Admit: 2016-02-17 | Payer: Medicaid Other | Source: Ambulatory Visit

## 2016-02-17 MED ORDER — IBUPROFEN 600 MG PO TABS: 600.0000 mg | ORAL_TABLET | Freq: Four times a day (QID) | ORAL | Status: DC | PRN

## 2016-02-17 MED ORDER — TRAMADOL HCL 50 MG PO TABS: 50.0000 mg | ORAL_TABLET | Freq: Four times a day (QID) | ORAL | Status: DC | PRN

## 2016-02-17 NOTE — Discharge Summary (Signed)
OB Discharge Summary     Patient Name: Olivia Alvarez DOB: 15-Jul-1981 MRN: [id number]  Date of admission: 02/14/2016 Delivering MD: Cam Hai D   Date of discharge: 02/17/2016  Admitting diagnosis: 40 WKS, CTX desires sterilization  Intrauterine pregnancy: [redacted]w[redacted]d     Secondary diagnosis:  Active Problems:   Active labor at term   Encounter for female sterilization procedure  Additional problems: Subutex use in preg     Discharge diagnosis: Term Pregnancy Delivered                                                                                                Post partum procedures:postpartum tubal ligation  Augmentation: none  Complications: None  Hospital course:  Onset of Labor With Vaginal Delivery     35 y.o. yo [id number] at [redacted]w[redacted]d was admitted in Latent Labor on 02/14/2016. Patient had an uncomplicated labor course as follows:  Membrane Rupture Time/Date: 2:45 AM ,02/15/2016   Intrapartum Procedures: Episiotomy: None [1]                                         Lacerations:  1st degree [2]  Patient had a delivery of a Viable infant. 02/15/2016  Information for the patient's newborn:  Tayelor, Osborne [[id number]]  Delivery Method: Vaginal, Spontaneous Delivery (Filed from Delivery Summary)    Pateint had an uncomplicated postpartum course. On PPD#0 she underwent a BTL per her request. She is ambulating, tolerating a regular diet, passing flatus, and urinating well. Patient is discharged home in stable condition on 02/17/2016.    Physical exam  Filed Vitals:   02/16/16 0545 02/16/16 1359 02/16/16 1911 02/17/16 0450  BP: 115/76 106/70 119/85 130/77  Pulse: 53 72 65 80  Temp: 98.5 F (36.9 C) 98.3 F (36.8 C) 98.3 F (36.8 C) 98.6 F (37 C)  TempSrc: Oral Oral Oral Oral  Resp: Height:      Weight:      SpO2: 97%      General: alert and cooperative Lochia: appropriate Uterine Fundus: firm Incision: Healing well with no significant  drainage DVT Evaluation: No evidence of DVT seen on physical exam. Labs: Lab Results  Component Value Date   WBC 9.3 02/14/2016   HGB 10.7* 02/14/2016   HCT 33.1* 02/14/2016   MCV 80.1 02/14/2016   PLT 245 02/14/2016   CMP Latest Ref Rng 08/01/2014  Glucose 70 - 99 mg/dL 93  BUN 6 - 23 mg/dL 9  Creatinine 6.96 - 2.95 mg/dL 2.84  Sodium 132 - 440 mEq/L 137  Potassium 3.7 - 5.3 mEq/L 4.2  Chloride 96 - 112 mEq/L 99  CO2 19 - 32 mEq/L 27  Calcium 8.4 - 10.5 mg/dL 9.4    Discharge instruction: per After Visit Summary and "Baby and Me Booklet".  After visit meds:    Medication List    STOP taking these medications        acetaminophen 500 MG tablet  Commonly known  as:  TYLENOL     promethazine 25 MG tablet  Commonly known as:  PHENERGAN      TAKE these medications        buprenorphine 8 MG Subl SL tablet  Commonly known as:  SUBUTEX  Place 8 mg under the tongue 2 (two) times daily.     [employer] PRENATAL GUMMY PO  Take 2 each by mouth daily.     ibuprofen 600 MG tablet  Commonly known as:  ADVIL,MOTRIN  Take 1 tablet (600 mg total) by mouth every 6 (six) hours as needed.     traMADol 50 MG tablet  Commonly known as:  ULTRAM  Take 1 tablet (50 mg total) by mouth every 6 (six) hours as needed for severe pain.        Diet: routine diet  Activity: Advance as tolerated. Pelvic rest for 6 weeks.   Outpatient follow up:6 weeks Follow up Appt:No future appointments. Follow up Visit:No Follow-up on file.  Postpartum contraception: Tubal Ligation (s/p)  Newborn Data: Live born female  Birth Weight: 6 lb 15.6 oz (3164 g) APGAR: 9, 9  Baby Feeding: Bottle Disposition:rooming in most likely for withdrawal obs   02/17/2016 Sala Tague, Manna, CNM  7:52 AM

## 2016-02-17 NOTE — Discharge Instructions (Signed)
Postpartum Tubal Ligation, Care After °Refer to this sheet in the next few weeks. These instructions provide you with information about caring for yourself after your procedure. Your health care provider may also give you more specific instructions. Your treatment has been planned according to current medical practices, but problems sometimes occur. Call your health care provider if you have any problems or questions after your procedure. °WHAT TO EXPECT AFTER THE PROCEDURE °After your procedure, it is common to have: °· Sore throat. °· Soreness at the incision site. °· Mild cramping. °· Tiredness. °· Mild nausea or vomiting. °HOME CARE INSTRUCTIONS °· Rest for the remainder of the day. °· Take medicines only as directed by your health care provider. These include over-the-counter medicines and prescription medicines. Do not take aspirin, which can cause bleeding. °· Over the next few days, gradually return to your normal activities and your normal diet. °· Avoid sexual intercourse for 2 weeks or as directed by your health care provider. °· Do not drive or operate heavy machinery while taking pain medicine. °· Do not lift anything that is heavier than 5 lb (2.3 kg) for 2 weeks or as directed by your health care provider. °· Do not take baths. Take showers only. Ask your health care provider when you can start taking baths. °· Take your temperature twice each day and write it down. °· Try to have help for the first 7-10 days for your household needs. °· There are many different ways to close and cover an incision, including stitches (sutures), skin glue, and adhesive strips. Follow instructions from your health care provider about: °¨ Incision care. °¨ Bandage (dressing) changes and removal. °¨ Incision closure removal. °· Check your incision area every day for signs of infection. Watch for: °¨ Redness, swelling, or pain. °¨ Fluid, blood, or pus. °· Keep all follow-up visits as directed by your health care  provider. °SEEK MEDICAL CARE IF: °· You have redness, swelling, or increasing pain in your incision area. °· You have fluid or pus coming from your incision for longer than 1 day. °· You notice a bad smell coming from your incision or your dressing. °· The edges of your incision break open after the sutures have been removed. °· Your pain does not decrease after 2-3 days. °· You have a rash. °· You repeatedly become dizzy or light-headed. °· You have a reaction to your medicine. °· Your pain medicine is not helping. °· You are constipated. °SEEK IMMEDIATE MEDICAL CARE IF:  °· You have a fever. °· You faint. °· You have increasing pain in your abdomen. °· You have bleeding or drainage from your suture sites or your vagina after surgery. °· You have shortness of breath or have difficulty breathing. °· You have chest pain or leg pain. °· You have ongoing nausea, vomiting, or diarrhea. °  °This information is not intended to replace advice given to you by your health care provider. Make sure you discuss any questions you have with your health care provider. °  °Document Released: 06/02/2012 Document Revised: 04/18/2015 Document Reviewed: 06/02/2012 °Elsevier Interactive Patient Education ©2016 [employer] °Postpartum Care After Vaginal Delivery °After you deliver your newborn (postpartum period), the usual stay in the hospital is 24-72 hours. If there were problems with your labor or delivery, or if you have other medical problems, you might be in the hospital longer.  °While you are in the hospital, you will receive help and instructions on how to care for yourself and your   newborn during the postpartum period.  °While you are in the hospital: °· Be sure to tell your nurses if you have pain or discomfort, as well as where you feel the pain and what makes the pain worse. °· If you had an incision made near your vagina (episiotomy) or if you had some tearing during delivery, the nurses may put ice packs on your  episiotomy or tear. The ice packs may help to reduce the pain and swelling. °· If you are breastfeeding, you may feel uncomfortable contractions of your uterus for a couple of weeks. This is normal. The contractions help your uterus get back to normal size. °· It is normal to have some bleeding after delivery. °¨ For the first 1-3 days after delivery, the flow is red and the amount may be similar to a period. °¨ It is common for the flow to start and stop. °¨ In the first few days, you may pass some small clots. Let your nurses know if you begin to pass large clots or your flow increases. °¨ Do not  flush blood clots down the toilet before having the nurse look at them. °¨ During the next 3-10 days after delivery, your flow should become more watery and pink or brown-tinged in color. °¨ Ten to fourteen days after delivery, your flow should be a small amount of yellowish-white discharge. °¨ The amount of your flow will decrease over the first few weeks after delivery. Your flow may stop in 6-8 weeks. Most women have had their flow stop by 12 weeks after delivery. °· You should change your sanitary pads frequently. °· Wash your hands thoroughly with soap and water for at least 20 seconds after changing pads, using the toilet, or before holding or feeding your newborn. °· You should feel like you need to empty your bladder within the first 6-8 hours after delivery. °· In case you become weak, lightheaded, or faint, call your nurse before you get out of bed for the first time and before you take a shower for the first time. °· Within the first few days after delivery, your breasts may begin to feel tender and full. This is called engorgement. Breast tenderness usually goes away within 48-72 hours after engorgement occurs. You may also notice milk leaking from your breasts. If you are not breastfeeding, do not stimulate your breasts. Breast stimulation can make your breasts produce more milk. °· Spending as much time as  possible with your newborn is very important. During this time, you and your newborn can feel close and get to know each other. Having your newborn stay in your room (rooming in) will help to strengthen the bond with your newborn.  It will give you time to get to know your newborn and become comfortable caring for your newborn. °· Your hormones change after delivery. Sometimes the hormone changes can temporarily cause you to feel sad or tearful. These feelings should not last more than a few days. If these feelings last longer than that, you should talk to your caregiver. °· If desired, talk to your caregiver about methods of family planning or contraception. °· Talk to your caregiver about immunizations. Your caregiver may want you to have the following immunizations before leaving the hospital: °¨ Tetanus, diphtheria, and pertussis (Tdap) or tetanus and diphtheria (Td) immunization. It is very important that you and your family (including grandparents) or others caring for your newborn are up-to-date with the Tdap or Td immunizations. The Tdap or Td immunization can   help protect your newborn from getting ill. °¨ Rubella immunization. °¨ Varicella (chickenpox) immunization. °¨ Influenza immunization. You should receive this annual immunization if you did not receive the immunization during your pregnancy. °  °This information is not intended to replace advice given to you by your health care provider. Make sure you discuss any questions you have with your health care provider. °  °Document Released: 09/29/2007 Document Revised: 08/26/2012 Document Reviewed: 07/29/2012 °Elsevier Interactive Patient Education ©2016 [employer] ° °

## 2016-02-18 LAB — TYPE AND SCREEN
ABO/RH(D): O NEG
ANTIBODY SCREEN: POSITIVE
DAT, IgG: NEGATIVE
UNIT DIVISION: 0
Unit division: 0

## 2016-02-20 ENCOUNTER — Encounter (HOSPITAL_COMMUNITY): Payer: Self-pay | Admitting: Obstetrics & Gynecology

## 2016-04-03 ENCOUNTER — Encounter: Payer: Self-pay | Admitting: Family Medicine

## 2016-04-03 ENCOUNTER — Ambulatory Visit (INDEPENDENT_AMBULATORY_CARE_PROVIDER_SITE_OTHER): Payer: Medicaid Other | Admitting: Family Medicine

## 2016-04-03 DIAGNOSIS — F419 Anxiety disorder, unspecified: Secondary | ICD-10-CM

## 2016-04-03 DIAGNOSIS — F329 Major depressive disorder, single episode, unspecified: Secondary | ICD-10-CM

## 2016-04-03 DIAGNOSIS — F418 Other specified anxiety disorders: Secondary | ICD-10-CM

## 2016-04-03 DIAGNOSIS — F32A Depression, unspecified: Secondary | ICD-10-CM

## 2016-04-03 MED ORDER — ESCITALOPRAM OXALATE 10 MG PO TABS: 10.0000 mg | ORAL_TABLET | Freq: Every day | ORAL | Status: DC

## 2016-04-03 NOTE — Progress Notes (Signed)
  Subjective:     Olivia Alvarez is a 35 y.o. female who presents for a postpartum visit. She is 5 weeks postpartum following a spontaneous vaginal delivery. I have fully reviewed the prenatal and intrapartum course. The delivery was at 39 gestational weeks. Outcome: spontaneous vaginal delivery. Anesthesia: epidural. Postpartum course has been normal. Baby's course has been complicated by NAS. Baby is feeding by bottle. Bleeding no bleeding. Bowel function is normal. Bladder function is normal. Patient is sexually active. Contraception method is tubal ligation. Postpartum depression screening: positive.  Patient has counselor, who recommended she restart her lexapro, which she started 1 week ago.  The following portions of the patient's history were reviewed and updated as appropriate: allergies, current medications, past family history, past medical history, past social history, past surgical history and problem list.  Review of Systems Pertinent items are noted in HPI.   Objective:    BP 99/70 mmHg  Pulse 89  Ht 5\' 6"  (1.676 m)  Wt 156 lb (70.761 kg)  BMI 25.19 kg/m2  General:  alert, cooperative and no distress     Lungs: clear to auscultation bilaterally and normal percussion bilaterally  Heart:  regular rate and rhythm, S1, S2 normal, no murmur, click, rub or gallop  Abdomen: soft, non-tender; bowel sounds normal; no masses,  no organomegaly                          Assessment:     Normal postpartum exam. Depression and anxiety.  Pap smear not done at today's visit.   Plan:    1. Contraception: tubal ligation 2. Continue lexapro 3. Follow up in: 1 year or as needed.

## 2016-12-14 ENCOUNTER — Emergency Department (HOSPITAL_COMMUNITY)
Admission: EM | Admit: 2016-12-14 | Discharge: 2016-12-14 | Disposition: A | Discharge status: Home / Self Care | Payer: Medicaid Other | Attending: Emergency Medicine | Admitting: Emergency Medicine

## 2016-12-14 ENCOUNTER — Encounter (HOSPITAL_COMMUNITY): Payer: Self-pay | Admitting: Emergency Medicine

## 2016-12-14 ENCOUNTER — Emergency Department (HOSPITAL_COMMUNITY): Payer: Medicaid Other

## 2016-12-14 DIAGNOSIS — S6991XA Unspecified injury of right wrist, hand and finger(s), initial encounter: Secondary | ICD-10-CM | POA: Diagnosis present

## 2016-12-14 DIAGNOSIS — Y9289 Other specified places as the place of occurrence of the external cause: Secondary | ICD-10-CM | POA: Insufficient documentation

## 2016-12-14 DIAGNOSIS — IMO0001 Reserved for inherently not codable concepts without codable children: Secondary | ICD-10-CM

## 2016-12-14 DIAGNOSIS — Z9889 Other specified postprocedural states: Secondary | ICD-10-CM

## 2016-12-14 DIAGNOSIS — S52591A Other fractures of lower end of right radius, initial encounter for closed fracture: Secondary | ICD-10-CM | POA: Diagnosis not present

## 2016-12-14 DIAGNOSIS — W08XXXA Fall from other furniture, initial encounter: Secondary | ICD-10-CM | POA: Diagnosis not present

## 2016-12-14 DIAGNOSIS — Y939 Activity, unspecified: Secondary | ICD-10-CM | POA: Insufficient documentation

## 2016-12-14 DIAGNOSIS — Y999 Unspecified external cause status: Secondary | ICD-10-CM | POA: Diagnosis not present

## 2016-12-14 MED ORDER — LIDOCAINE HCL (PF) 1 % IJ SOLN
30.0000 mL | Freq: Once | INTRAMUSCULAR | Status: AC
  Administered 2016-12-14: 30 mL
  Filled 2016-12-14: qty 30

## 2016-12-14 MED ORDER — IBUPROFEN 200 MG PO TABS
400.0000 mg | ORAL_TABLET | Freq: Once | ORAL | Status: DC | PRN
Start: 2016-12-14 — End: 2016-12-14
  Filled 2016-12-14: qty 2

## 2016-12-14 MED ORDER — ACETAMINOPHEN 500 MG PO TABS: 1000.0000 mg | ORAL_TABLET | Freq: Four times a day (QID) | ORAL | 0 refills | Status: AC | PRN

## 2016-12-14 MED ORDER — HYDROMORPHONE HCL 2 MG/ML IJ SOLN
1.0000 mg | Freq: Once | INTRAMUSCULAR | Status: AC
  Administered 2016-12-14: 1 mg via INTRAVENOUS
  Filled 2016-12-14: qty 1

## 2016-12-14 MED ORDER — ONDANSETRON HCL 4 MG/2ML IJ SOLN
4.0000 mg | Freq: Once | INTRAMUSCULAR | Status: AC
  Administered 2016-12-14: 4 mg via INTRAVENOUS
  Filled 2016-12-14: qty 2

## 2016-12-14 MED ORDER — IBUPROFEN 400 MG PO TABS: 400.0000 mg | ORAL_TABLET | Freq: Four times a day (QID) | ORAL | 0 refills | Status: DC | PRN

## 2016-12-14 NOTE — Consult Note (Signed)
Lysle RubensKimberly Chandler Kimberlee Nearingtheridge is an 35 y.o. female.   Chief Complaint: right distal radius fracture HPI: 35 yo rhd female states she fell from a stepstool earlier today injuring right wrist.  [facility].  She reports throbbing pain of 8/10 severity in right wrist.  Alleviated by pain medication and aggravated by motion.  She reports previous injury to the right arm as a child.  Case discussed with Dr. Effie ShyWentz and his note from 12/14/2016 reviewed. Xrays viewed and interpreted by me: ap, lateral, oblique views show comminuted intraarticular distal radius fracture with dorsal angulation. Labs reviewed: none  Allergies:  Allergies  Allergen Reactions  . Sulfa Antibiotics Rash    Past Medical History:  Diagnosis Date  . Anxiety   . Chronic back pain   . Depression   . Misuse of prescription only drugs    opiates  . Pregnancy complicated by subutex maintenance, antepartum (HCC)   . Vaginal Pap smear, abnormal   . Varicella     Past Surgical History:  Procedure Laterality Date  . right arm broken     surgery  . TUBAL LIGATION Bilateral 02/15/2016   Procedure: POST PARTUM TUBAL LIGATION;  Surgeon: Willodean Rosenthalarolyn Harraway-Smith, MD;  Location: [facility];  Service: Gynecology;  Laterality: Bilateral;    Family History: Family History  Problem Relation Age of Onset  . Cancer Father     throat  . Heart disease Maternal Grandmother   . Cancer Maternal Grandfather   . Heart disease Maternal Grandfather   . Diabetes Maternal Grandfather   . Cancer Paternal Grandfather     lung  . Stroke Neg Hx   . Mental illness Neg Hx   . Drug abuse Neg Hx     Social History:   reports that she has never smoked. She does not have any smokeless tobacco history on file. She reports that she does not drink alcohol or use drugs.  Medications:  (Not in a hospital admission)  No results found for this or any previous visit (from the past 48 hour(s)).  Dg Elbow  Complete Right  Result Date: 12/14/2016 CLINICAL DATA:  Fall.  Right wrist deformity. EXAM: RIGHT ELBOW - COMPLETE 3+ VIEW COMPARISON:  None. FINDINGS: There is no evidence of fracture, dislocation, or joint effusion. There is no evidence of arthropathy or other focal bone abnormality. Soft tissues are unremarkable. IMPRESSION: Negative. Electronically Signed   By: Charlett NoseKevin  Dover M.D.   On: 12/14/2016 12:44   Dg Forearm Right  Result Date: 12/14/2016 CLINICAL DATA:  Fall from stool today.  Deformity to right wrist EXAM: RIGHT FOREARM - 2 VIEW COMPARISON:  None FINDINGS: There is a displaced distal right radial fracture. Distal fragments are displaced 1 shaft width posteriorly with posterior angulation. No visible ulnar abnormality. IMPRESSION: Displaced, angulated distal right radial fracture. Electronically Signed   By: Charlett NoseKevin  Dover M.D.   On: 12/14/2016 12:43   Dg Wrist 2 Views Right  Result Date: 12/14/2016 CLINICAL DATA:  Post reduction EXAM: RIGHT WRIST - 2 VIEW COMPARISON:  Earlier same day FINDINGS: Two views through a fiberglass cast/explant show improved position of the comminuted distal radial fragments. Distal radial articular surface is shows very slight dorsal tilt. Most of the distal fragments are displaced by about 4-5 mm from the expected location of the distal radius. Small ulnar styloid fracture as previously seen. IMPRESSION: Improved position and alignment. Very slight dorsal displacement of the distal fragments and very slight dorsal tilt  of the distal radial articular surface. Electronically Signed   By: Paulina FusiMark  Shogry M.D.   On: 12/14/2016 17:33   Dg Wrist Complete Right  Result Date: 12/14/2016 CLINICAL DATA:  Fall, wrist deformity EXAM: RIGHT WRIST - COMPLETE 3+ VIEW COMPARISON:  None. FINDINGS: There is a comminuted common displaced distal right radial fracture. The distal fragments are displaced posteriorly with posterior angulation. No visible ulnar abnormality. IMPRESSION:  Displaced and angulated distal right radial fracture. Electronically Signed   By: Charlett NoseKevin  Dover M.D.   On: 12/14/2016 12:45     A comprehensive review of systems was negative. Review of Systems: No fevers, chills, night sweats, chest pain, shortness of breath, nausea, vomiting, diarrhea, constipation, easy bleeding or bruising, headaches, dizziness, vision changes, fainting.  Blood pressure 114/78, pulse 90, temperature 97.9 F (36.6 C), temperature source Oral, resp. rate 16, height 5\' 6"  (1.676 m), weight 68 kg (150 lb), SpO2 96 %, unknown if currently breastfeeding.  General appearance: alert, cooperative and appears stated age Head: Normocephalic, without obvious abnormality, atraumatic Neck: supple, symmetrical, trachea midline Extremities: Intact sensation and capillary refill all digits.  +epl/fpl/io.  No wounds. Visible deformity to right wrist. Pulses: 2+ and symmetric Skin: Skin color, texture, turgor normal. No rashes or lesions Neurologic: Grossly normal Incision/Wound: none  Assessment/Plan Right comminuted intraarticular distal radius fracture.  Recommend closed reduction under hematoma block in ED.  Risks, benefits, and alternatives of procedure were discussed and the patient agrees with the plan of care.  Procedure note: hematoma block with 10 cc 1% plain lidocaine.  Adequate to give anesthesia at fracture site.  Closed reduction of right distal radius performed.  Good radial pulse after reduction.  Improved motion and sensation in digits.  Sugar tong splint placed.  Post reduction radiographs show improved alignment with residual dorsal displacement.  Follow up in office this week.  Pain meds per ed.  Patient tolerated procedure well.    Jawana Reagor R 12/14/2016, 5:38 PM

## 2016-12-14 NOTE — ED Notes (Signed)
Patient transported to X-ray 

## 2016-12-14 NOTE — ED Triage Notes (Signed)
Pt from home states she fell off a stool, unsure how she felt onto ground. Pt tearful stating pain in right wrist and arm up to elbow.

## 2016-12-14 NOTE — ED Notes (Signed)
PT DISCHARGED. INSTRUCTIONS AND PRESCRIPTIONS GIVEN. AAOX4. PT IN NO APPARENT DISTRESS. THE OPPORTUNITY TO ASK QUESTIONS WAS PROVIDED. 

## 2016-12-14 NOTE — ED Provider Notes (Signed)
[facility]-EMERGENCY DEPT Provider Note   CSN: [id number] Arrival date & time: 12/14/16  1216     History   Chief Complaint Chief Complaint  Patient presents with  . Wrist Injury    HPI Olivia Alvarez is a 35 y.o. female.  She presents for evaluation of right wrist injury, onset when she accidentally fell off a stool in her son's room. No other injuries. There are no other known modifying factors.  HPI  Past Medical History:  Diagnosis Date  . Anxiety   . Chronic back pain   . Depression   . Misuse of prescription only drugs    opiates  . Pregnancy complicated by subutex maintenance, antepartum (HCC)   . Vaginal Pap smear, abnormal   . Varicella     Patient Active Problem List   Diagnosis Date Noted  . Encounter for female sterilization procedure 02/15/2016  . Anxiety and depression 02/20/2015  . ADVERSE DRUG REACTION, SULFA 05/17/2010    Past Surgical History:  Procedure Laterality Date  . right arm broken     surgery  . TUBAL LIGATION Bilateral 02/15/2016   Procedure: POST PARTUM TUBAL LIGATION;  Surgeon: Willodean Rosenthalarolyn Harraway-Smith, MD;  Location: [facility];  Service: Gynecology;  Laterality: Bilateral;    OB History    Gravida Para Term Preterm AB Living   4 2 2   2 2    SAB TAB Ectopic Multiple Live Births   2 0   0 2       Home Medications    Prior to Admission medications   Medication Sig Start Date End Date Taking? Authorizing Provider  escitalopram (LEXAPRO) 10 MG tablet Take 1 tablet (10 mg total) by mouth daily. Patient not taking: Reported on 12/14/2016 04/03/16   Levie HeritageJacob J Stinson, DO    Family History Family History  Problem Relation Age of Onset  . Cancer Father     throat  . Heart disease Maternal Grandmother   . Cancer Maternal Grandfather   . Heart disease Maternal Grandfather   . Diabetes Maternal Grandfather   . Cancer Paternal Grandfather     lung  . Stroke Neg Hx   . Mental illness Neg Hx   . Drug abuse Neg Hx      Social History Social History  Substance Use Topics  . Smoking status: Never Smoker  . Smokeless tobacco: Not on file  . Alcohol use No     Allergies   Sulfa antibiotics   Review of Systems Review of Systems  All other systems reviewed and are negative.    Physical Exam Updated Vital Signs BP 113/90 (BP Location: Left Arm)   Pulse 94   Temp 97.9 F (36.6 C) (Oral)   Resp 20   Ht 5\' 6"  (1.676 m)   Wt 150 lb (68 kg)   SpO2 100%   BMI 24.21 kg/m   Physical Exam  Constitutional: She is oriented to person, place, and time. She appears well-developed and well-nourished.  HENT:  Head: Normocephalic and atraumatic.  Eyes: Conjunctivae and EOM are normal. Pupils are equal, round, and reactive to light.  Neck: Normal range of motion and phonation normal. Neck supple.  Cardiovascular: Normal rate.   Pulmonary/Chest: Effort normal.  Musculoskeletal: Normal range of motion.  Left wrist swelling, radial aspect with deformity. Neurovascular intact distally in the right hand. No tenderness or swelling of the right elbow, or shoulder.  Neurological: She is alert and oriented to person, place, and time. She exhibits normal muscle  tone.  Skin: Skin is warm and dry.  Psychiatric: She has a normal mood and affect. Her behavior is normal. Judgment and thought content normal.  Nursing note and vitals reviewed.    ED Treatments / Results  Labs (all labs ordered are listed, but only abnormal results are displayed) Labs Reviewed - No data to display  EKG  EKG Interpretation None       Radiology Dg Elbow Complete Right  Result Date: 12/14/2016 CLINICAL DATA:  Fall.  Right wrist deformity. EXAM: RIGHT ELBOW - COMPLETE 3+ VIEW COMPARISON:  None. FINDINGS: There is no evidence of fracture, dislocation, or joint effusion. There is no evidence of arthropathy or other focal bone abnormality. Soft tissues are unremarkable. IMPRESSION: Negative. Electronically Signed   By: Charlett NoseKevin   Dover M.D.   On: 12/14/2016 12:44   Dg Forearm Right  Result Date: 12/14/2016 CLINICAL DATA:  Fall from stool today.  Deformity to right wrist EXAM: RIGHT FOREARM - 2 VIEW COMPARISON:  None FINDINGS: There is a displaced distal right radial fracture. Distal fragments are displaced 1 shaft width posteriorly with posterior angulation. No visible ulnar abnormality. IMPRESSION: Displaced, angulated distal right radial fracture. Electronically Signed   By: Charlett NoseKevin  Dover M.D.   On: 12/14/2016 12:43   Dg Wrist Complete Right  Result Date: 12/14/2016 CLINICAL DATA:  Fall, wrist deformity EXAM: RIGHT WRIST - COMPLETE 3+ VIEW COMPARISON:  None. FINDINGS: There is a comminuted common displaced distal right radial fracture. The distal fragments are displaced posteriorly with posterior angulation. No visible ulnar abnormality. IMPRESSION: Displaced and angulated distal right radial fracture. Electronically Signed   By: Charlett NoseKevin  Dover M.D.   On: 12/14/2016 12:45    Procedures Procedures (including critical care time)  Medications Ordered in ED Medications  ibuprofen (ADVIL,MOTRIN) tablet 400 mg (not administered)  lidocaine (PF) (XYLOCAINE) 1 % injection 30 mL (not administered)  ondansetron (ZOFRAN) injection 4 mg (4 mg Intravenous Given 12/14/16 1459)  HYDROmorphone (DILAUDID) injection 1 mg (1 mg Intravenous Given 12/14/16 1459)     Initial Impression / Assessment and Plan / ED Course  I have reviewed the triage vital signs and the nursing notes.  Pertinent labs & imaging results that were available during my care of the patient were reviewed by me and considered in my medical decision making (see chart for details).  Clinical Course as of Dec 15 1535  Sat Dec 14, 2016  1533 DG Wrist Complete Right [EW]  1533 DG Wrist Complete Right [EW]    Clinical Course User Index [EW] Mancel BaleElliott Jalen Oberry, MD    Medications  ibuprofen (ADVIL,MOTRIN) tablet 400 mg (not administered)  lidocaine (PF) (XYLOCAINE)  1 % injection 30 mL (not administered)  ondansetron (ZOFRAN) injection 4 mg (4 mg Intravenous Given 12/14/16 1459)  HYDROmorphone (DILAUDID) injection 1 mg (1 mg Intravenous Given 12/14/16 1459)    Patient Vitals for the past 24 hrs:  BP Temp Temp src Pulse Resp SpO2 Height Weight  12/14/16 1501 113/90 - - 94 20 100 % - -  12/14/16 1431 - - - - - - 5\' 6"  (1.676 m) 150 lb (68 kg)  12/14/16 1219 (!) 131/102 97.9 F (36.6 C) Oral 111 20 100 % - -   13:36- discussed with hand surgery, he will see the patient in the emergency department for reduction, splinting, and arrangement of follow-up care  3:41 PM Reevaluation with update and discussion. After initial assessment and treatment, an updated evaluation reveals No change in clinical status. Findings discussed  with patient, all questions answered. Toddy Boyd L    Final Clinical Impressions(s) / ED Diagnoses   Final diagnoses:  Other closed fracture of distal end of right radius, initial encounter   Mechanical fall with isolated injury, right wrist. Fracture requiring reduction, by hand surgery.  Nursing Notes Reviewed/ Care Coordinated, and agree without changes. Applicable Imaging Reviewed.  Interpretation of Laboratory Data incorporated into ED treatment  Plan- as per hand surgery  New Prescriptions New Prescriptions   No medications on file     Mancel Bale, MD 12/14/16 1542

## 2016-12-16 DIAGNOSIS — S52501A Unspecified fracture of the lower end of right radius, initial encounter for closed fracture: Secondary | ICD-10-CM

## 2016-12-16 HISTORY — DX: Unspecified fracture of the lower end of right radius, initial encounter for closed fracture: S52.501A

## 2016-12-19 ENCOUNTER — Encounter (HOSPITAL_BASED_OUTPATIENT_CLINIC_OR_DEPARTMENT_OTHER): Payer: Self-pay | Admitting: *Deleted

## 2016-12-20 ENCOUNTER — Other Ambulatory Visit: Payer: Self-pay | Admitting: Orthopedic Surgery

## 2016-12-26 ENCOUNTER — Ambulatory Visit (HOSPITAL_BASED_OUTPATIENT_CLINIC_OR_DEPARTMENT_OTHER): Payer: Self-pay | Admitting: Certified Registered"

## 2016-12-26 ENCOUNTER — Encounter (HOSPITAL_BASED_OUTPATIENT_CLINIC_OR_DEPARTMENT_OTHER): Payer: Self-pay | Admitting: Certified Registered"

## 2016-12-26 ENCOUNTER — Ambulatory Visit (HOSPITAL_BASED_OUTPATIENT_CLINIC_OR_DEPARTMENT_OTHER)
Admission: RE | Admit: 2016-12-26 | Discharge: 2016-12-26 | Disposition: A | Discharge status: Home / Self Care | Payer: Self-pay | Source: Ambulatory Visit | Attending: Orthopedic Surgery | Admitting: Orthopedic Surgery

## 2016-12-26 ENCOUNTER — Encounter (HOSPITAL_BASED_OUTPATIENT_CLINIC_OR_DEPARTMENT_OTHER)
Admission: RE | Disposition: A | Discharge status: Home / Self Care | Payer: Self-pay | Source: Ambulatory Visit | Attending: Orthopedic Surgery

## 2016-12-26 DIAGNOSIS — S52571A Other intraarticular fracture of lower end of right radius, initial encounter for closed fracture: Secondary | ICD-10-CM | POA: Insufficient documentation

## 2016-12-26 DIAGNOSIS — Z882 Allergy status to sulfonamides status: Secondary | ICD-10-CM | POA: Insufficient documentation

## 2016-12-26 DIAGNOSIS — Z8614 Personal history of Methicillin resistant Staphylococcus aureus infection: Secondary | ICD-10-CM | POA: Insufficient documentation

## 2016-12-26 DIAGNOSIS — Z809 Family history of malignant neoplasm, unspecified: Secondary | ICD-10-CM | POA: Insufficient documentation

## 2016-12-26 DIAGNOSIS — F419 Anxiety disorder, unspecified: Secondary | ICD-10-CM | POA: Insufficient documentation

## 2016-12-26 DIAGNOSIS — Z8249 Family history of ischemic heart disease and other diseases of the circulatory system: Secondary | ICD-10-CM | POA: Insufficient documentation

## 2016-12-26 DIAGNOSIS — F329 Major depressive disorder, single episode, unspecified: Secondary | ICD-10-CM | POA: Insufficient documentation

## 2016-12-26 DIAGNOSIS — Z800 Family history of malignant neoplasm of digestive organs: Secondary | ICD-10-CM | POA: Insufficient documentation

## 2016-12-26 DIAGNOSIS — W1789XA Other fall from one level to another, initial encounter: Secondary | ICD-10-CM | POA: Insufficient documentation

## 2016-12-26 DIAGNOSIS — Z801 Family history of malignant neoplasm of trachea, bronchus and lung: Secondary | ICD-10-CM | POA: Insufficient documentation

## 2016-12-26 DIAGNOSIS — Y939 Activity, unspecified: Secondary | ICD-10-CM | POA: Insufficient documentation

## 2016-12-26 DIAGNOSIS — Z833 Family history of diabetes mellitus: Secondary | ICD-10-CM | POA: Insufficient documentation

## 2016-12-26 HISTORY — DX: Personal history of Methicillin resistant Staphylococcus aureus infection: Z86.14

## 2016-12-26 HISTORY — DX: Dental restoration status: Z98.811

## 2016-12-26 HISTORY — PX: OPEN REDUCTION INTERNAL FIXATION (ORIF) DISTAL RADIAL FRACTURE: SHX5989

## 2016-12-26 HISTORY — DX: Unspecified fracture of the lower end of right radius, initial encounter for closed fracture: S52.501A

## 2016-12-26 SURGERY — OPEN REDUCTION INTERNAL FIXATION (ORIF) DISTAL RADIUS FRACTURE
Anesthesia: General | Site: Wrist | Laterality: Right

## 2016-12-26 MED ORDER — ONDANSETRON HCL 4 MG/2ML IJ SOLN
INTRAMUSCULAR | Status: DC | PRN
  Administered 2016-12-26: 4 mg via INTRAVENOUS

## 2016-12-26 MED ORDER — LIDOCAINE 2% (20 MG/ML) 5 ML SYRINGE
INTRAMUSCULAR | Status: DC | PRN
  Administered 2016-12-26: 60 mg via INTRAVENOUS

## 2016-12-26 MED ORDER — PROPOFOL 10 MG/ML IV BOLUS
INTRAVENOUS | Status: DC | PRN
  Administered 2016-12-26: 130 mg via INTRAVENOUS

## 2016-12-26 MED ORDER — PROPOFOL 10 MG/ML IV BOLUS
INTRAVENOUS | Status: AC
  Filled 2016-12-26: qty 20

## 2016-12-26 MED ORDER — FENTANYL CITRATE (PF) 100 MCG/2ML IJ SOLN
INTRAMUSCULAR | Status: AC
  Filled 2016-12-26: qty 2

## 2016-12-26 MED ORDER — DEXAMETHASONE SODIUM PHOSPHATE 10 MG/ML IJ SOLN
INTRAMUSCULAR | Status: DC | PRN
  Administered 2016-12-26: 10 mg via INTRAVENOUS

## 2016-12-26 MED ORDER — FENTANYL CITRATE (PF) 100 MCG/2ML IJ SOLN: 25.0000 ug | INTRAMUSCULAR | Status: DC | PRN

## 2016-12-26 MED ORDER — CEFAZOLIN SODIUM-DEXTROSE 2-4 GM/100ML-% IV SOLN
INTRAVENOUS | Status: AC
Start: 2016-12-26 — End: 2016-12-26
  Filled 2016-12-26: qty 100

## 2016-12-26 MED ORDER — DEXAMETHASONE SODIUM PHOSPHATE 10 MG/ML IJ SOLN
INTRAMUSCULAR | Status: AC
  Filled 2016-12-26: qty 1

## 2016-12-26 MED ORDER — SCOPOLAMINE 1 MG/3DAYS TD PT72: 1.0000 | MEDICATED_PATCH | Freq: Once | TRANSDERMAL | Status: DC | PRN

## 2016-12-26 MED ORDER — CEFAZOLIN SODIUM-DEXTROSE 2-4 GM/100ML-% IV SOLN
2.0000 g | INTRAVENOUS | Status: AC
  Administered 2016-12-26: 2 g via INTRAVENOUS

## 2016-12-26 MED ORDER — LIDOCAINE 2% (20 MG/ML) 5 ML SYRINGE
INTRAMUSCULAR | Status: AC
  Filled 2016-12-26: qty 5

## 2016-12-26 MED ORDER — IBUPROFEN 800 MG PO TABS: 800.0000 mg | ORAL_TABLET | Freq: Three times a day (TID) | ORAL | 0 refills | Status: DC | PRN

## 2016-12-26 MED ORDER — CHLORHEXIDINE GLUCONATE 4 % EX LIQD: 60.0000 mL | Freq: Once | CUTANEOUS | Status: DC

## 2016-12-26 MED ORDER — ROPIVACAINE HCL 7.5 MG/ML IJ SOLN
INTRAMUSCULAR | Status: DC | PRN
  Administered 2016-12-26: 20 mL via PERINEURAL

## 2016-12-26 MED ORDER — FENTANYL CITRATE (PF) 100 MCG/2ML IJ SOLN
50.0000 ug | INTRAMUSCULAR | Status: DC | PRN
  Administered 2016-12-26: 50 ug via INTRAVENOUS

## 2016-12-26 MED ORDER — EPHEDRINE 5 MG/ML INJ
INTRAVENOUS | Status: AC
  Filled 2016-12-26: qty 10

## 2016-12-26 MED ORDER — MIDAZOLAM HCL 2 MG/2ML IJ SOLN
1.0000 mg | INTRAMUSCULAR | Status: DC | PRN
  Administered 2016-12-26: 2 mg via INTRAVENOUS

## 2016-12-26 MED ORDER — ONDANSETRON HCL 4 MG/2ML IJ SOLN
INTRAMUSCULAR | Status: AC
  Filled 2016-12-26: qty 2

## 2016-12-26 MED ORDER — EPHEDRINE SULFATE 50 MG/ML IJ SOLN
INTRAMUSCULAR | Status: DC | PRN
  Administered 2016-12-26 (×2): 10 mg via INTRAVENOUS

## 2016-12-26 MED ORDER — PROMETHAZINE HCL 25 MG/ML IJ SOLN: 6.2500 mg | INTRAMUSCULAR | Status: DC | PRN

## 2016-12-26 MED ORDER — HYDROCODONE-ACETAMINOPHEN 5-325 MG PO TABS: ORAL_TABLET | ORAL | 0 refills | Status: DC

## 2016-12-26 MED ORDER — LACTATED RINGERS IV SOLN
INTRAVENOUS | Status: DC
  Administered 2016-12-26: 10 mL/h via INTRAVENOUS

## 2016-12-26 MED ORDER — MIDAZOLAM HCL 2 MG/2ML IJ SOLN
INTRAMUSCULAR | Status: AC
  Filled 2016-12-26: qty 2

## 2016-12-26 SURGICAL SUPPLY — 71 items
BANDAGE ACE 3X5.8 VEL STRL LF (GAUZE/BANDAGES/DRESSINGS) ×3 IMPLANT
BIT DRILL 2.0 LNG QUCK RELEASE (BIT) IMPLANT
BIT DRILL 2.8X5 QR DISP (BIT) ×2 IMPLANT
BLADE SURG 15 STRL LF DISP TIS (BLADE) ×2 IMPLANT
BLADE SURG 15 STRL SS (BLADE) ×6
BNDG CMPR 9X4 STRL LF SNTH (GAUZE/BANDAGES/DRESSINGS) ×1
BNDG ESMARK 4X9 LF (GAUZE/BANDAGES/DRESSINGS) ×3 IMPLANT
BNDG GAUZE ELAST 4 BULKY (GAUZE/BANDAGES/DRESSINGS) ×3 IMPLANT
BNDG PLASTER X FAST 3X3 WHT LF (CAST SUPPLIES) ×30 IMPLANT
BNDG PLSTR 9X3 FST ST WHT (CAST SUPPLIES) ×10
CHLORAPREP W/TINT 26ML (MISCELLANEOUS) ×3 IMPLANT
CORDS BIPOLAR (ELECTRODE) ×3 IMPLANT
COVER BACK TABLE 60X90IN (DRAPES) ×3 IMPLANT
COVER MAYO STAND STRL (DRAPES) ×3 IMPLANT
CUFF TOURNIQUET SINGLE 18IN (TOURNIQUET CUFF) ×2 IMPLANT
CUFF TOURNIQUET SINGLE 24IN (TOURNIQUET CUFF) IMPLANT
DRAPE EXTREMITY T 121X128X90 (DRAPE) ×3 IMPLANT
DRAPE OEC MINIVIEW 54X84 (DRAPES) ×3 IMPLANT
DRAPE SURG 17X23 STRL (DRAPES) ×3 IMPLANT
DRILL 2.0 LNG QUICK RELEASE (BIT) ×3
GAUZE SPONGE 4X4 12PLY STRL (GAUZE/BANDAGES/DRESSINGS) ×3 IMPLANT
GAUZE XEROFORM 1X8 LF (GAUZE/BANDAGES/DRESSINGS) ×3 IMPLANT
GLOVE BIO SURGEON STRL SZ 6.5 (GLOVE) ×1 IMPLANT
GLOVE BIO SURGEON STRL SZ7.5 (GLOVE) ×5 IMPLANT
GLOVE BIO SURGEONS STRL SZ 6.5 (GLOVE) ×1
GLOVE BIOGEL PI IND STRL 7.0 (GLOVE) IMPLANT
GLOVE BIOGEL PI IND STRL 8 (GLOVE) ×1 IMPLANT
GLOVE BIOGEL PI IND STRL 8.5 (GLOVE) IMPLANT
GLOVE BIOGEL PI INDICATOR 7.0 (GLOVE) ×2
GLOVE BIOGEL PI INDICATOR 8 (GLOVE) ×4
GLOVE BIOGEL PI INDICATOR 8.5 (GLOVE) ×2
GLOVE SURG ORTHO 8.0 STRL STRW (GLOVE) ×4 IMPLANT
GOWN STRL REUS W/ TWL LRG LVL3 (GOWN DISPOSABLE) ×1 IMPLANT
GOWN STRL REUS W/TWL LRG LVL3 (GOWN DISPOSABLE) ×6
GOWN STRL REUS W/TWL XL LVL3 (GOWN DISPOSABLE) ×3 IMPLANT
GUIDEWIRE ORTHO 0.054X6 (WIRE) ×6 IMPLANT
NDL HYPO 25X1 1.5 SAFETY (NEEDLE) IMPLANT
NEEDLE HYPO 25X1 1.5 SAFETY (NEEDLE) IMPLANT
NS IRRIG 1000ML POUR BTL (IV SOLUTION) ×3 IMPLANT
PACK BASIN DAY SURGERY FS (CUSTOM PROCEDURE TRAY) ×3 IMPLANT
PAD CAST 3X4 CTTN HI CHSV (CAST SUPPLIES) ×1 IMPLANT
PADDING CAST ABS 4INX4YD NS (CAST SUPPLIES) ×2
PADDING CAST ABS COTTON 4X4 ST (CAST SUPPLIES) ×1 IMPLANT
PADDING CAST COTTON 3X4 STRL (CAST SUPPLIES) ×3
PLATE PROXIMAL VDU ACULOC (Plate) ×2 IMPLANT
SCREW CORT FT 18X2.3XLCK HEX (Screw) IMPLANT
SCREW CORTICAL LOCKING 2.3X18M (Screw) ×18 IMPLANT
SCREW FX18X2.3XSMTH LCK NS CRT (Screw) IMPLANT
SCREW HEXALOBE NON-LOCK 3.5X14 (Screw) ×2 IMPLANT
SCREW NLCKG 13 3.5X13 HEXA (Screw) IMPLANT
SCREW NON-LOCK 3.5X13 (Screw) ×3 IMPLANT
SCREW NONLOCK HEX 3.5X12 (Screw) ×2 IMPLANT
SLEEVE SCD COMPRESS KNEE MED (MISCELLANEOUS) ×2 IMPLANT
SLING ARM FOAM STRAP MED (SOFTGOODS) ×2 IMPLANT
STOCKINETTE 4X48 STRL (DRAPES) ×3 IMPLANT
SUCTION FRAZIER HANDLE 10FR (MISCELLANEOUS)
SUCTION TUBE FRAZIER 10FR DISP (MISCELLANEOUS) IMPLANT
SUT ETHILON 3 0 PS 1 (SUTURE) IMPLANT
SUT ETHILON 4 0 PS 2 18 (SUTURE) ×3 IMPLANT
SUT VIC AB 2-0 SH 27 (SUTURE)
SUT VIC AB 2-0 SH 27XBRD (SUTURE) IMPLANT
SUT VIC AB 3-0 PS1 18 (SUTURE)
SUT VIC AB 3-0 PS1 18XBRD (SUTURE) IMPLANT
SUT VICRYL 4-0 PS2 18IN ABS (SUTURE) ×3 IMPLANT
SYR BULB 3OZ (MISCELLANEOUS) ×3 IMPLANT
SYR CONTROL 10ML LL (SYRINGE) IMPLANT
TOWEL OR 17X24 6PK STRL BLUE (TOWEL DISPOSABLE) ×6 IMPLANT
TOWEL OR NON WOVEN STRL DISP B (DISPOSABLE) ×3 IMPLANT
TUBE CONNECTING 20'X1/4 (TUBING)
TUBE CONNECTING 20X1/4 (TUBING) IMPLANT
UNDERPAD 30X30 (UNDERPADS AND DIAPERS) ×3 IMPLANT

## 2016-12-26 NOTE — Anesthesia Procedure Notes (Addendum)
Procedure Name: LMA Insertion Date/Time: 12/26/2016 4:27 PM Performed by: Curly ShoresRAFT, Daisha Filosa W Pre-anesthesia Checklist: Patient identified, Emergency Drugs available, Suction available and Patient being monitored Patient Re-evaluated:Patient Re-evaluated prior to inductionOxygen Delivery Method: Circle system utilized Preoxygenation: Pre-oxygenation with 100% oxygen Intubation Type: IV induction Ventilation: Mask ventilation without difficulty LMA: LMA inserted LMA Size: 3.0 Number of attempts: 1 Airway Equipment and Method: Bite block Placement Confirmation: positive ETCO2 and breath sounds checked- equal and bilateral Tube secured with: Tape Dental Injury: Teeth and Oropharynx as per pre-operative assessment

## 2016-12-26 NOTE — Progress Notes (Signed)
Assisted Dr. Turk with right, ultrasound guided, supraclavicular block. Side rails up, monitors on throughout procedure. See vital signs in flow sheet. Tolerated Procedure well. 

## 2016-12-26 NOTE — Anesthesia Procedure Notes (Signed)
Anesthesia Regional Block:  Supraclavicular block  Pre-Anesthetic Checklist: ,, timeout performed, Correct Patient, Correct Site, Correct Laterality, Correct Procedure, Correct Position, site marked, Risks and benefits discussed,  Surgical consent,  Pre-op evaluation,  At surgeon's request and post-op pain management  Laterality: Right  Prep: chloraprep       Needles:  Injection technique: Single-shot  Needle Type: Echogenic Needle     Needle Length: 9cm 9 cm Needle Gauge: 21 and 21 G    Additional Needles:  Procedures: ultrasound guided (picture in chart) Supraclavicular block Narrative:  Start time: 12/26/2016 2:20 PM End time: 12/26/2016 2:25 PM Injection made incrementally with aspirations every 5 mL.  Performed by: Personally  Anesthesiologist: Cecile HearingURK, Remon Quinto EDWARD  Additional Notes: No pain on injection. No increased resistance to injection. Injection made in 5cc increments.  Good needle visualization.  Patient tolerated procedure well.

## 2016-12-26 NOTE — Transfer of Care (Signed)
Immediate Anesthesia Transfer of Care Note  Patient: Olivia Alvarez  Procedure(s) Performed: Procedure(s): OPEN REDUCTION INTERNAL FIXATION (ORIF) DISTAL RADIAL FRACTURE (Right)  Patient Location: PACU  Anesthesia Type:General  Level of Consciousness: awake and sedated  Airway & Oxygen Therapy: Patient Spontanous Breathing and Patient connected to face mask oxygen  Post-op Assessment: Report given to RN and Post -op Vital signs reviewed and stable  Post vital signs: Reviewed and stable  Last Vitals:  Vitals:   12/26/16 1428 12/26/16 1738  BP: 105/66   Pulse:  (!) 108  Resp:  14  Temp:      Last Pain:  Vitals:   12/26/16 1342  TempSrc: Oral  PainSc: 3       Patients Stated Pain Goal: 3 (12/26/16 1342)  Complications: No apparent anesthesia complications

## 2016-12-26 NOTE — Op Note (Signed)
12/26/2016 [facility]  Operative Note  Pre Op Diagnosis: Right comminuted intraarticular distal radius fracture  Post Op Diagnosis: Right comminuted intraarticular distal radius fracture  Procedure: ORIF Right comminuted intraarticular distal radius fracture, 2 intraarticular fragments  Surgeon: Betha Loa, MD  Assistant: Cindee Salt, MD  Anesthesia: General and Regional  Fluids: Per anesthesia flow sheet  EBL: minimal  Complications: None  Specimen: None  Tourniquet Time:  Total Tourniquet Time Documented: Upper Arm (Right) - 52 minutes Total: Upper Arm (Right) - 52 minutes   Disposition: Stable to PACU  INDICATIONS:  Olivia Alvarez is a 36 y.o. female who fell from a stepstool 12/14/16 injuring her right wrist.  Seen at [facility] where XR revealed distal radius fracture.  Closed reduction performed.  She followed up in the office.  We discussed nonoperative and operative treatment options.  She wished to proceed with operative fixation.  Risks, benefits, and alternatives of surgery were discussed including the risk of blood loss; infection; damage to nerves, vessels, tendons, ligaments, bone; failure of surgery; need for additional surgery; complications with wound healing; continued pain; nonunion; malunion; stiffness.  We also discussed the possible need for bone graft and the benefits and risks including the possibility of disease transmission.  She voiced understanding of these risks and elected to proceed.   OPERATIVE COURSE:  After being identified preoperatively by myself, the patient and I agreed upon the procedure and site of procedure.  Surgical site was marked.  The risks, benefits and alternatives of the surgery were reviewed and she wished to proceed.  Surgical consent had been signed.  She was given IV Ancef as preoperative antibiotic prophylaxis.  She was transferred to the operating room and placed on the operating room table in supine position  with the Right upper extremity on an armboard. General and Regional anesthesia was induced by the anesthesiologist.  The Right upper extremity was prepped and draped in normal sterile orthopedic fashion.  A surgical pause was performed between the surgeons, anesthesia and operating room staff, and all were in agreement as to the patient, procedure and site of procedure.  Tourniquet at the proximal aspect of the extremity was inflated to 250 mmHg after exsanguination of the limb with an Esmarch bandage.  Standard volar Sherilyn Cooter approach was used.  The bipolar electrocautery was used to obtain hemostasis.  The superficial and deep portions of the FCR tendon sheath were incised, and the FCR and FPL were swept ulnarly to protect the palmar cutaneous branch of the median nerve.  The brachioradialis was released at the radial side of the radius.  The pronator quadratus was released and elevated with the periosteal elevator.  The fracture site was identified and cleared of soft tissue interposition and hematoma.  It was reduced under direct visualization.  The fracture was intraarticular at the distal radioulnar joint.   An [employer] volar distal radial locking plate was selected.  It was secured to the bone with the guidepins.  C-arm was used in AP and lateral projections to ensure appropriate reduction and position of the hardware and adjustments made as necessary.  Standard AO drilling and measuring technique was used.  A single screw was placed in the slotted hole in the shaft of the plate.  The distal holes were filled with locking pegs with the exception of the styloid holes, which were filled with locking screws.  The remaining holes in the shaft of the plate were filled with nonlocking screws.  Good purchase was obtained.  C-arm was used in AP, lateral and oblique projections to ensure appropriate reduction and position of hardware, which was the case.  There was no intra-articular penetration.  The wound was copiously  irrigated with sterile saline.  Pronator quadratus was repaired back over top of the plate using 4-0 Vicryl suture.  Vicryl suture was placed in the subcutaneous tissues in an inverted interrupted fashion and the skin was closed with 4-0 nylon in a horizontal mattress fashion.  There was good pronation and supination of the wrist without crepitance.  The wound was then dressed with sterile Xeroform, 4x4s, and wrapped with a Kerlix bandage.  A volar splint was placed and wrapped with Kerlix and Ace bandage.  Tourniquet was deflated at 52 minutes.  Fingertips were pink with brisk capillary refill after deflation of the tourniquet.  Operative drapes were broken down.  The patient was awoken from anesthesia safely.  She was transferred back to the stretcher and taken to the PACU in stable condition.  I will see her back in the office in one week for postoperative followup.  She has declined narcotic pain medication due to previous addiction issues.    Tami RibasKUZMA,Zohaib Heeney R, MD Electronically signed, 12/26/16

## 2016-12-26 NOTE — Op Note (Signed)
I assisted Surgeon(s) and Role:    * Betha LoaKevin Khamani Daniely, MD - Primary    * Cindee SaltGary Leontyne Manville, MD on the Procedure(s): OPEN REDUCTION INTERNAL FIXATION (ORIF) DISTAL RADIAL FRACTURE on 12/26/2016.  I provided assistance on this case as follows: approach, isolation of the fracture, debridement of the fracture,reduction of the fracture, stabilization of the fracture, application of the plates and screws, closure of the wound, application of the dressings and splints. I ws present for the entire case.  Electronically signed by: Nicki ReaperKUZMA,Dagan Heinz R, MD Date: 12/26/2016 Time: 5:32 PM

## 2016-12-26 NOTE — Brief Op Note (Signed)
12/26/2016  5:33 PM  PATIENT:  Olivia Alvarez  36 y.o. female  PRE-OPERATIVE DIAGNOSIS:  DISPLACED COMMINUTED RIGHT DISTAL RADIUS FRACTURE  POST-OPERATIVE DIAGNOSIS:  DISPLACED COMMINUTED RIGHT DISTAL RADIUS   PROCEDURE:  Procedure(s): OPEN REDUCTION INTERNAL FIXATION (ORIF) DISTAL RADIAL FRACTURE (Right)  SURGEON:  Surgeon(s) and Role:    * Betha LoaKevin Boleslaus Holloway, MD - Primary    * Cindee SaltGary Kenza Munar, MD  PHYSICIAN ASSISTANT:   ASSISTANTS: Cindee SaltGary Carrick Rijos, MD   ANESTHESIA:   regional and general  EBL:  Total I/O In: 1400 [I.V.:1400] Out: 5 [Blood:5]  BLOOD ADMINISTERED:none  DRAINS: none   LOCAL MEDICATIONS USED:  NONE  SPECIMEN:  No Specimen  DISPOSITION OF SPECIMEN:  N/A  COUNTS:  YES  TOURNIQUET:   Total Tourniquet Time Documented: Upper Arm (Right) - 52 minutes Total: Upper Arm (Right) - 52 minutes   DICTATION: .Note written in EPIC  PLAN OF CARE: Discharge to home after PACU  PATIENT DISPOSITION:  PACU - hemodynamically stable.

## 2016-12-26 NOTE — Anesthesia Preprocedure Evaluation (Addendum)
Anesthesia Evaluation  Patient identified by MRN, date of birth, ID band Patient awake    Reviewed: Allergy & Precautions, NPO status , Patient's Chart, lab work & pertinent test results  Airway Mallampati: I  TM Distance: >3 FB Neck ROM: Full    Dental  (+) Teeth Intact, Dental Advisory Given   Pulmonary neg pulmonary ROS,    Pulmonary exam normal breath sounds clear to auscultation       Cardiovascular Exercise Tolerance: Good negative cardio ROS Normal cardiovascular exam Rhythm:Regular Rate:Normal     Neuro/Psych PSYCHIATRIC DISORDERS Anxiety Depression negative neurological ROS     GI/Hepatic negative GI ROS, Neg liver ROS,   Endo/Other  negative endocrine ROS  Renal/GU negative Renal ROS     Musculoskeletal Right distal radius fracture   Abdominal   Peds  Hematology negative hematology ROS (+)   Anesthesia Other Findings Day of surgery medications reviewed with the patient.  Reproductive/Obstetrics negative OB ROS                            Anesthesia Physical Anesthesia Plan  ASA: II  Anesthesia Plan: General   Post-op Pain Management:  Regional for Post-op pain   Induction: Intravenous  Airway Management Planned: LMA  Additional Equipment:   Intra-op Plan:   Post-operative Plan: Extubation in OR  Informed Consent: I have reviewed the patients History and Physical, chart, labs and discussed the procedure including the risks, benefits and alternatives for the proposed anesthesia with the patient or authorized representative who has indicated his/her understanding and acceptance.   Dental advisory given  Plan Discussed with: CRNA  Anesthesia Plan Comments: (Risks/benefits of general anesthesia discussed with patient including risk of damage to teeth, lips, gum, and tongue, nausea/vomiting, allergic reactions to medications, and the possibility of heart attack, stroke  and death.  All patient questions answered.  Patient wishes to proceed.  Discussed risks and benefits of supraclavicular nerve block including failure, bleeding, infection, nerve damage, weakness, shortness of breath, pneumothorax. Questions answered. Patient consents to block. )        Anesthesia Quick Evaluation

## 2016-12-26 NOTE — H&P (Signed)
Olivia Alvarez is an 36 y.o. female.   Chief Complaint: right distal radius fracture HPI: 36 yo rhd female states she fell from a stepstool 12/14/16 injuring right wrist.  Seen in ED where XR revealed distal radius fracture.  Closed reduction performed.  She wishes to undergo operative fixation of the fracture.  Allergies:  Allergies  Allergen Reactions  . Sulfa Antibiotics Other (See Comments)    UNKNOWN    Past Medical History:  Diagnosis Date  . Anxiety   . Dental crown present   . Depression   . Distal radius fracture, right 12/2016   displaced/comminuted  . History of MRSA infection    arm    Past Surgical History:  Procedure Laterality Date  . TUBAL LIGATION Bilateral 02/15/2016   Procedure: POST PARTUM TUBAL LIGATION;  Surgeon: Willodean Rosenthalarolyn Harraway-Smith, MD;  Location: [facility];  Service: Gynecology;  Laterality: Bilateral;    Family History: Family History  Problem Relation Age of Onset  . Cancer Father     throat  . Heart disease Maternal Grandmother   . Cancer Maternal Grandfather   . Heart disease Maternal Grandfather   . Diabetes Maternal Grandfather   . Cancer Paternal Grandfather     lung    Social History:   reports that she has never smoked. She has never used smokeless tobacco. She reports that she drinks alcohol. She reports that she does not use drugs.  Medications: No prescriptions prior to admission.    No results found for this or any previous visit (from the past 48 hour(s)).  No results found.   A comprehensive review of systems was negative.  Height 5\' 6"  (1.676 m), weight 72.6 kg (160 lb), last menstrual period 11/29/2016, not currently breastfeeding.  General appearance: alert, cooperative and appears stated age Head: Normocephalic, without obvious abnormality, atraumatic Neck: supple, symmetrical, trachea midline Resp: clear to auscultation bilaterally Cardio: regular rate and rhythm GI: non-tender Extremities: Intact sensation  and capillary refill all digits.  +epl/fpl/io.  No wounds.  Pulses: 2+ and symmetric Skin: Skin color, texture, turgor normal. No rashes or lesions Neurologic: Grossly normal Incision/Wound:none  Assessment/Plan Right distal radius fracture.  Non operative and operative treatment options were discussed with the patient and patient wishes to proceed with operative treatment. Risks, benefits, and alternatives of surgery were discussed and the patient agrees with the plan of care.   Olivia Alvarez R 12/26/2016, 10:42 AM

## 2016-12-26 NOTE — Discharge Instructions (Addendum)

## 2016-12-27 ENCOUNTER — Encounter (HOSPITAL_BASED_OUTPATIENT_CLINIC_OR_DEPARTMENT_OTHER): Payer: Self-pay | Admitting: Orthopedic Surgery

## 2016-12-27 NOTE — Anesthesia Postprocedure Evaluation (Signed)
Anesthesia Post Note  Patient: Evette CristalKimberly C Nuno  Procedure(s) Performed: Procedure(s) (LRB): OPEN REDUCTION INTERNAL FIXATION (ORIF) DISTAL RADIAL FRACTURE (Right)  Patient location during evaluation: PACU Anesthesia Type: General and Regional Level of consciousness: awake and alert Pain management: pain level controlled Vital Signs Assessment: post-procedure vital signs reviewed and stable Respiratory status: spontaneous breathing, nonlabored ventilation, respiratory function stable and patient connected to nasal cannula oxygen Cardiovascular status: blood pressure returned to baseline and stable Postop Assessment: no signs of nausea or vomiting Anesthetic complications: no       Last Vitals:  Vitals:   12/26/16 1756 12/26/16 1800  BP: 131/83 124/71  Pulse: (!) 111 92  Resp:    Temp:  36.7 C    Last Pain:  Vitals:   12/26/16 1745  TempSrc:   PainSc: 0-No pain                 Cecile HearingStephen Edward Terrace Fontanilla

## 2017-05-16 IMAGING — CR DG ELBOW COMPLETE 3+V*R*
4 series · 4 of 4 positions shown · non-contrast
Comparison: None.

CLINICAL DATA: Fall.  Right wrist deformity.

EXAM:
RIGHT ELBOW - COMPLETE 3+ VIEW

[x elbow lat right]
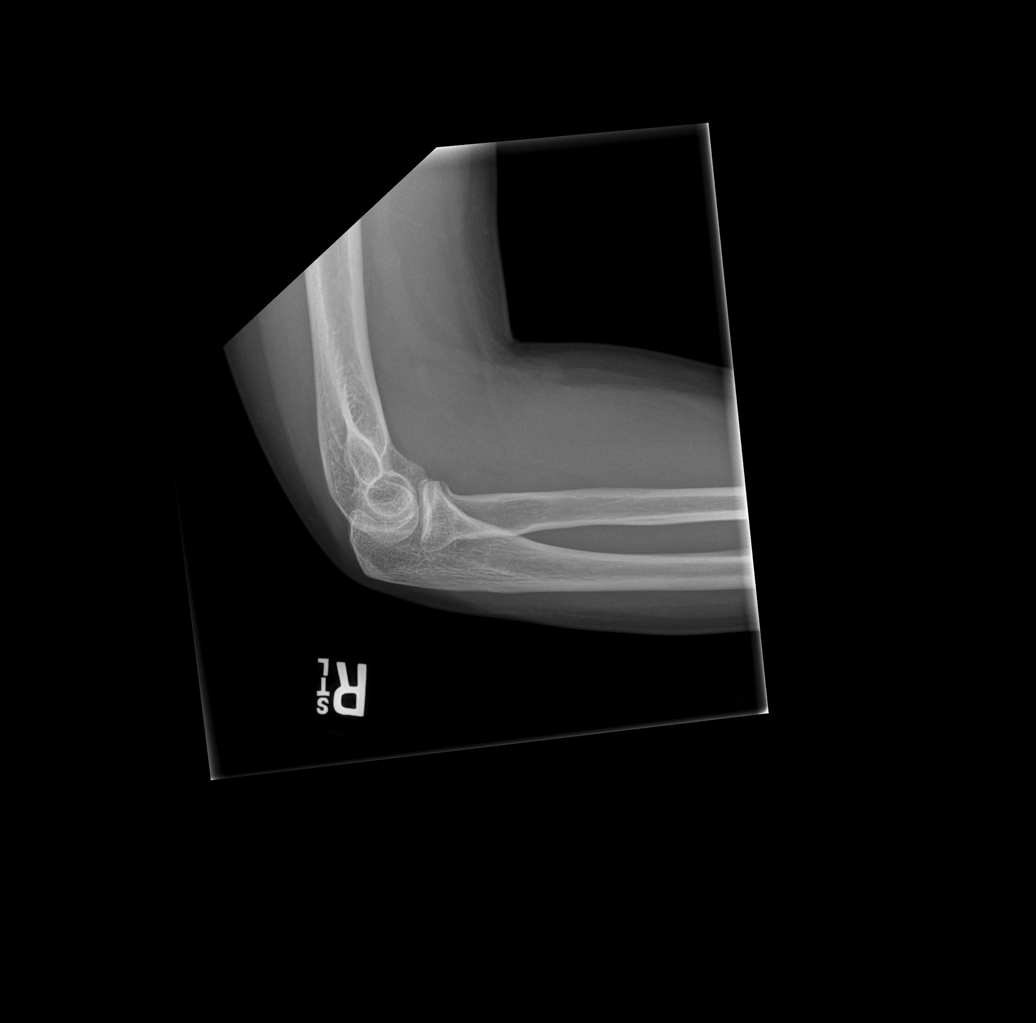

[x elbow ap right]
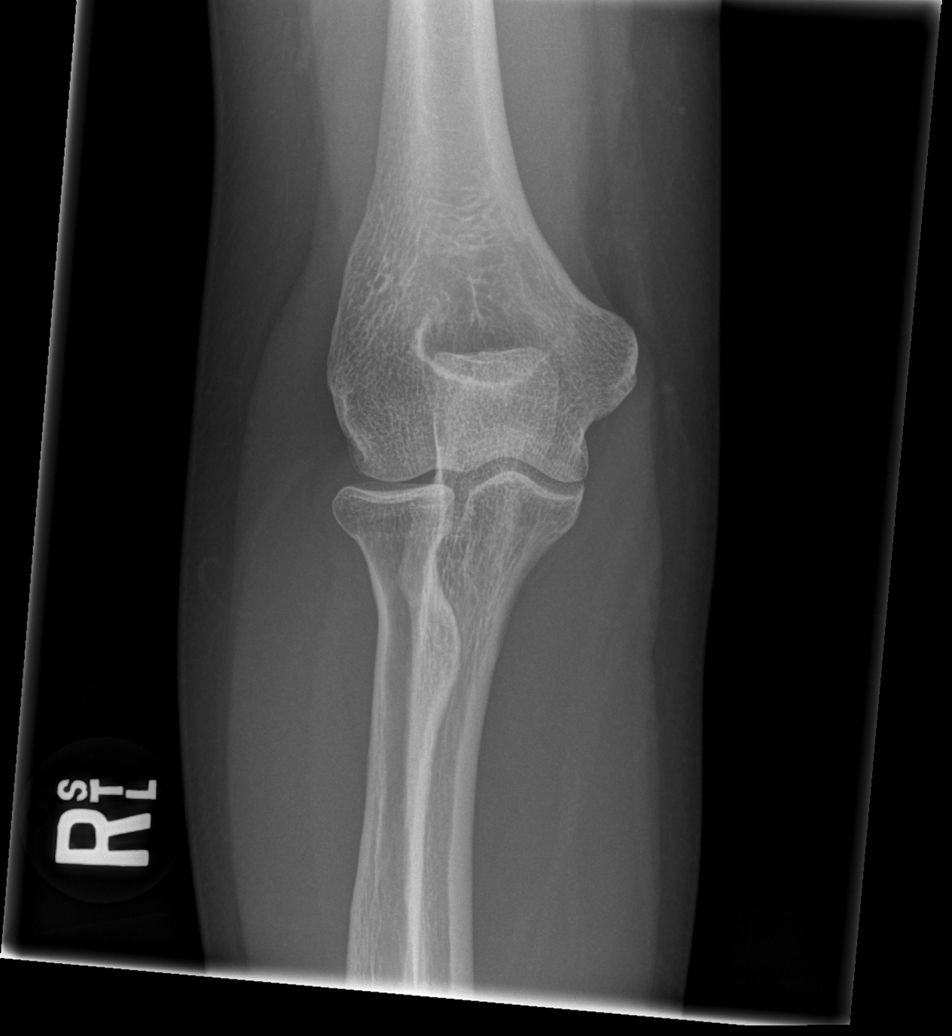

[x elbow obl right (1 of 2)]
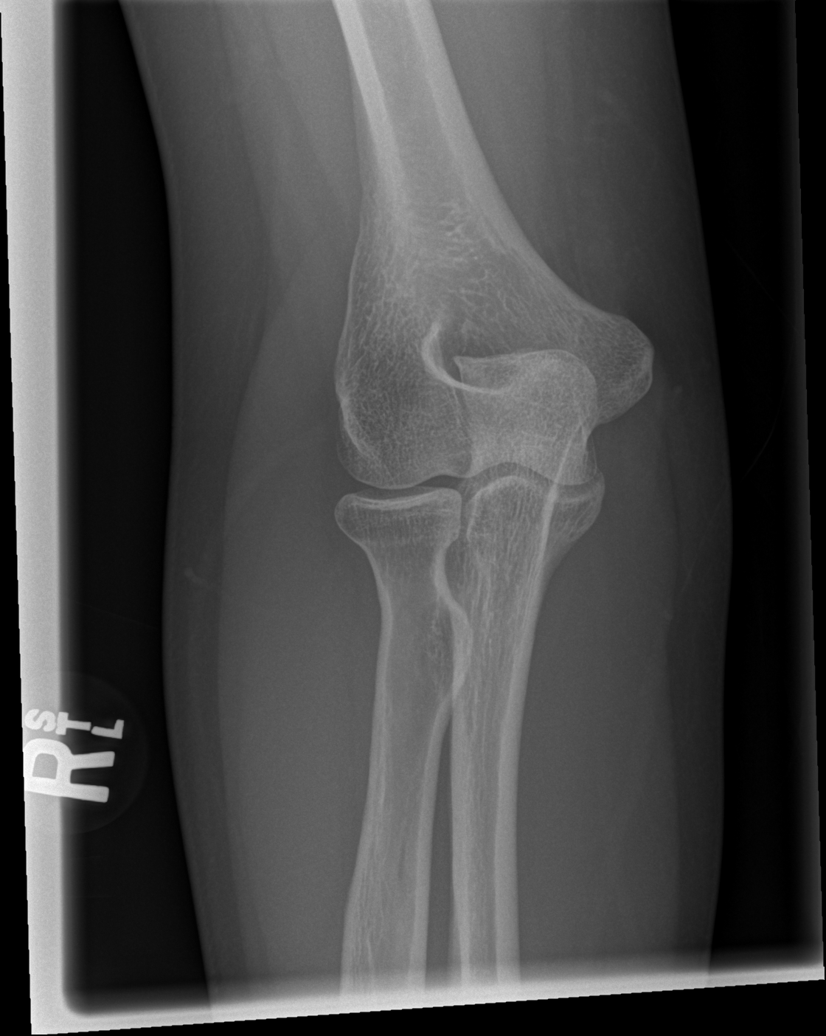

[x elbow obl right (2 of 2)]
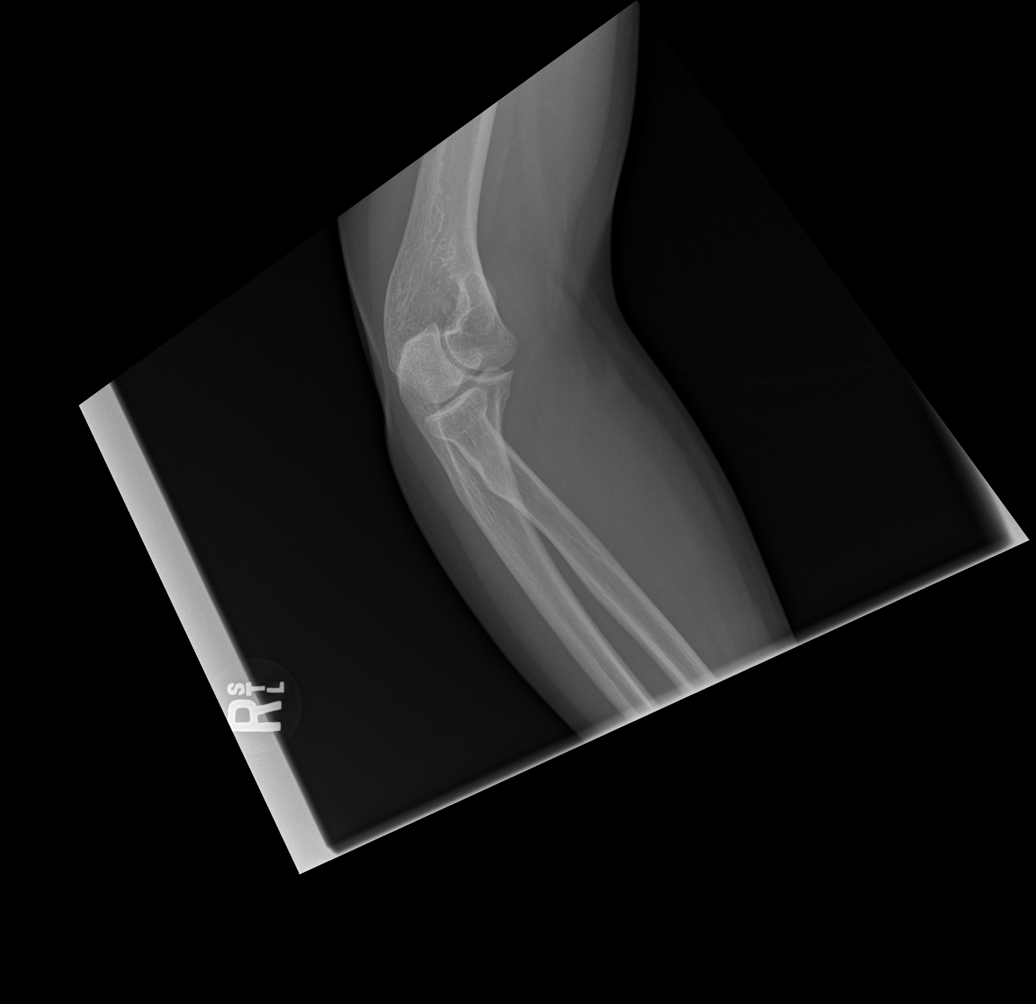

[4 of 4 positions shown; findings below may reference images not displayed]

FINDINGS: There is no evidence of fracture, dislocation, or joint effusion.
There is no evidence of arthropathy or other focal bone abnormality.
Soft tissues are unremarkable.
IMPRESSION: Negative.

## 2023-10-03 ENCOUNTER — Observation Stay (HOSPITAL_BASED_OUTPATIENT_CLINIC_OR_DEPARTMENT_OTHER): Payer: Self-pay

## 2023-10-03 ENCOUNTER — Emergency Department (HOSPITAL_COMMUNITY): Payer: Self-pay

## 2023-10-03 ENCOUNTER — Other Ambulatory Visit: Payer: Self-pay

## 2023-10-03 ENCOUNTER — Observation Stay (HOSPITAL_COMMUNITY)
Admission: EM | Admit: 2023-10-03 | Discharge: 2023-10-04 | Disposition: A | Discharge status: Home / Self Care | Payer: Self-pay | Attending: Internal Medicine | Admitting: Internal Medicine

## 2023-10-03 ENCOUNTER — Encounter (HOSPITAL_COMMUNITY): Payer: Self-pay

## 2023-10-03 DIAGNOSIS — D62 Acute posthemorrhagic anemia: Secondary | ICD-10-CM | POA: Diagnosis present

## 2023-10-03 DIAGNOSIS — R600 Localized edema: Secondary | ICD-10-CM

## 2023-10-03 DIAGNOSIS — D649 Anemia, unspecified: Principal | ICD-10-CM | POA: Insufficient documentation

## 2023-10-03 DIAGNOSIS — N939 Abnormal uterine and vaginal bleeding, unspecified: Secondary | ICD-10-CM | POA: Diagnosis present

## 2023-10-03 DIAGNOSIS — R109 Unspecified abdominal pain: Secondary | ICD-10-CM | POA: Insufficient documentation

## 2023-10-03 DIAGNOSIS — D259 Leiomyoma of uterus, unspecified: Secondary | ICD-10-CM | POA: Diagnosis present

## 2023-10-03 DIAGNOSIS — M7989 Other specified soft tissue disorders: Secondary | ICD-10-CM

## 2023-10-03 LAB — COMPREHENSIVE METABOLIC PANEL
ALT: 9 U/L (ref 0–44)
AST: 14 U/L — ABNORMAL LOW (ref 15–41)
Albumin: 4.2 g/dL (ref 3.5–5.0)
Alkaline Phosphatase: 45 U/L (ref 38–126)
Anion gap: 7 (ref 5–15)
BUN: 11 mg/dL (ref 6–20)
CO2: 25 mmol/L (ref 22–32)
Calcium: 8.8 mg/dL — ABNORMAL LOW (ref 8.9–10.3)
Chloride: 106 mmol/L (ref 98–111)
Creatinine, Ser: 0.6 mg/dL (ref 0.44–1.00)
GFR, Estimated: >60 mL/min (ref >60)
Glucose, Bld: 96 mg/dL (ref 70–99)
Potassium: 4 mmol/L (ref 3.5–5.1)
Sodium: 138 mmol/L (ref 135–145)
Total Bilirubin: 0.5 mg/dL (ref 0.3–1.2)
Total Protein: 7.1 g/dL (ref 6.5–8.1)

## 2023-10-03 LAB — CBC WITH DIFFERENTIAL/PLATELET
Abs Immature Granulocytes: 0.01 10*3/uL (ref 0.00–0.07)
Basophils Absolute: 0 10*3/uL (ref 0.0–0.1)
Basophils Relative: 1 %
Eosinophils Absolute: 0.2 10*3/uL (ref 0.0–0.5)
Eosinophils Relative: 6 %
HCT: 17.8 % — ABNORMAL LOW (ref 36.0–46.0)
Hemoglobin: 4.3 g/dL — CL (ref 12.0–15.0)
Immature Granulocytes: 0 %
Lymphocytes Relative: 40 %
Lymphs Abs: 1.1 10*3/uL (ref 0.7–4.0)
MCH: 15.5 pg — ABNORMAL LOW (ref 26.0–34.0)
MCHC: 24.2 g/dL — ABNORMAL LOW (ref 30.0–36.0)
MCV: 64 fL — ABNORMAL LOW (ref 80.0–100.0)
Monocytes Absolute: 0.3 10*3/uL (ref 0.1–1.0)
Monocytes Relative: 10 %
Neutro Abs: 1.1 10*3/uL — ABNORMAL LOW (ref 1.7–7.7)
Neutrophils Relative %: 43 %
Platelets: 634 10*3/uL — ABNORMAL HIGH (ref 150–400)
RBC: 2.78 MIL/uL — ABNORMAL LOW (ref 3.87–5.11)
RDW: 28.4 % — ABNORMAL HIGH (ref 11.5–15.5)
WBC: 2.7 10*3/uL — ABNORMAL LOW (ref 4.0–10.5)
nRBC: 0.7 % — ABNORMAL HIGH (ref 0.0–0.2)

## 2023-10-03 LAB — URINALYSIS, ROUTINE W REFLEX MICROSCOPIC
Bacteria, UA: NONE SEEN
Bilirubin Urine: NEGATIVE
Glucose, UA: NEGATIVE mg/dL
Ketones, ur: NEGATIVE mg/dL
Leukocytes,Ua: NEGATIVE
Nitrite: NEGATIVE
Protein, ur: NEGATIVE mg/dL
Specific Gravity, Urine: 1.019 (ref 1.005–1.030)
pH: 5 (ref 5.0–8.0)

## 2023-10-03 LAB — FERRITIN: Ferritin: 1 ng/mL — ABNORMAL LOW (ref 11–307)

## 2023-10-03 LAB — IRON AND TIBC
Iron: 8 ug/dL — ABNORMAL LOW (ref 28–170)
Saturation Ratios: 2 % — ABNORMAL LOW (ref 10.4–31.8)
TIBC: 395 ug/dL (ref 250–450)
UIBC: 387 ug/dL

## 2023-10-03 LAB — RETICULOCYTES
Immature Retic Fract: 6.9 % (ref 2.3–15.9)
RBC.: 2.78 MIL/uL — ABNORMAL LOW (ref 3.87–5.11)
Retic Count, Absolute: 23.1 10*3/uL (ref 19.0–186.0)
Retic Ct Pct: 0.8 % (ref 0.4–3.1)

## 2023-10-03 LAB — VITAMIN B12: Vitamin B-12: 243 pg/mL (ref 180–914)

## 2023-10-03 LAB — LIPASE, BLOOD: Lipase: 23 U/L (ref 11–51)

## 2023-10-03 LAB — PREPARE RBC (CROSSMATCH)

## 2023-10-03 LAB — PREGNANCY, URINE: Preg Test, Ur: NEGATIVE

## 2023-10-03 LAB — FOLATE: Folate: 6.5 ng/mL (ref >5.9)

## 2023-10-03 MED ORDER — ONDANSETRON HCL 4 MG/2ML IJ SOLN: 4.0000 mg | Freq: Four times a day (QID) | INTRAMUSCULAR | Status: DC | PRN

## 2023-10-03 MED ORDER — SODIUM CHLORIDE 0.9% IV SOLUTION: Freq: Once | INTRAVENOUS | Status: AC

## 2023-10-03 MED ORDER — ACETAMINOPHEN 325 MG PO TABS
650.0000 mg | ORAL_TABLET | Freq: Four times a day (QID) | ORAL | Status: DC | PRN
  Administered 2023-10-03: 650 mg via ORAL
  Filled 2023-10-03: qty 2

## 2023-10-03 MED ORDER — ALBUTEROL SULFATE (2.5 MG/3ML) 0.083% IN NEBU: 2.5000 mg | INHALATION_SOLUTION | RESPIRATORY_TRACT | Status: DC | PRN

## 2023-10-03 MED ORDER — TRAZODONE HCL 50 MG PO TABS: 25.0000 mg | ORAL_TABLET | Freq: Every evening | ORAL | Status: DC | PRN

## 2023-10-03 MED ORDER — IOHEXOL 300 MG/ML  SOLN
100.0000 mL | Freq: Once | INTRAMUSCULAR | Status: AC | PRN
  Administered 2023-10-03: 100 mL via INTRAVENOUS

## 2023-10-03 MED ORDER — SODIUM CHLORIDE (PF) 0.9 % IJ SOLN
INTRAMUSCULAR | Status: AC
  Filled 2023-10-03: qty 50

## 2023-10-03 MED ORDER — ONDANSETRON HCL 4 MG PO TABS: 4.0000 mg | ORAL_TABLET | Freq: Four times a day (QID) | ORAL | Status: DC | PRN

## 2023-10-03 MED ORDER — ACETAMINOPHEN 650 MG RE SUPP: 650.0000 mg | Freq: Four times a day (QID) | RECTAL | Status: DC | PRN

## 2023-10-03 NOTE — ED Triage Notes (Signed)
Pt complaining of bilateral leg swelling. Was seen at her PCP for this complaint, and during lab work was told her HGB was 4.4. Pt was then told to go to the ER. Denies any pain, dizziness, or blood in stool. Pt does endorse some SOB.

## 2023-10-03 NOTE — ED Provider Notes (Signed)
Patient is a handoff from [place], [place]. Plan is to discuss case with hospitalist and admit for symptomatic anemia and large uterine fibroid. Currently on menstrual cycle and bleeding through ultra tampon every 1.5 hours. Physical Exam  BP 111/70   Pulse 82   Temp 98.9 F (37.2 C)   Resp 16   Ht 5\' 6"  (1.676 m)   Wt 74.8 kg   LMP 10/03/2023   SpO2 100%   BMI 26.63 kg/m   Physical Exam Vitals and nursing note reviewed.  Constitutional:      General: She is not in acute distress.    Appearance: She is well-developed. She is not diaphoretic.  HENT:     Head: Normocephalic and atraumatic.     Mouth/Throat:     Mouth: Mucous membranes are moist.     Comments: Mucous membranes pale Eyes:     Comments: Conjunctiva pale  Cardiovascular:     Rate and Rhythm: Regular rhythm. Tachycardia present.     Heart sounds: Normal heart sounds.  Pulmonary:     Effort: Pulmonary effort is normal.     Breath sounds: Normal breath sounds.  Abdominal:     Palpations: Abdomen is soft.     Comments: Firmness to suprapubic area, suspect fibroid uterus.  Musculoskeletal:        General: No tenderness.     Cervical back: Neck supple.     Right lower leg: Edema present.     Left lower leg: Edema present.  Skin:    General: Skin is warm and dry.     Coloration: Skin is pale.  Neurological:     Mental Status: She is alert and oriented to person, place, and time.  Psychiatric:        Behavior: Behavior normal.     Procedures  Procedures  ED Course / MDM    Medical Decision Making Amount and/or Complexity of Data Reviewed Labs: ordered. Radiology: ordered.  Risk Prescription drug management. Decision regarding hospitalization.   Patient is a handoff from [place], [place]. Please see their note for full HPI and physical exam findings. Plan for admission due to severe anemia with Hgb at 4.3 likely secondary to vaginal bleeding. Has had heavier menstrual cycles over the last 3 months. Sees  [facility]. Has received 2 units of blood. Mildly symptomatic with some fatigue and pale conjunctiva.  Spoke with Dr. Erenest Blank, hospitalist, for admission of patient for vaginal bleeding and symptomatic anemia. Only asked to get in touch with patient's OBGYN group to inform them that patient is here in the case that they need to be consulted for evaluation.  Secure messaged with Dr. Timothy Lasso who had Dr. Clint Lipps call me. Dr. Clint Lipps is patient's personal OBGYN. She was informed regarding patient case, findings, and plan for admission. Informed Dr. Erenest Blank and connected him to Dr. Clint Lipps via secure chat for further discussion of case. Patient admitted.       Smitty Knudsen, PA-C 10/03/23 1918    Durwin Glaze, MD 10/03/23 [phone]

## 2023-10-03 NOTE — Plan of Care (Signed)
[facility] Tonsillectomy/Adenoidectomy, Postoperative PEDS care plan entered in error.

## 2023-10-03 NOTE — ED Provider Notes (Signed)
[place] EMERGENCY DEPARTMENT AT [facility] Provider Note   CSN: [id number] Arrival date & time: 10/03/23  1016     History  Chief Complaint  Patient presents with   Leg Swelling   Abnormal Lab    Olivia Alvarez is a 42 y.o. female.  42 year old female presents with concern for bilateral lower extremity edema and anemia.  Patient states that she has had swelling to her bilateral lower extremities ongoing for several months, went to urgent care yesterday and was contacted this morning advising her to go to the emergency room for hemoglobin of 4.  Patient states that she is fatigued at times otherwise denies current chest pain or shortness of breath.  Went to her gynecologist last month for vaginal bleeding (heavier for the last 3 months), states that she had her menstrual cycle and then continued to bleed off and on throughout the month.  States that she is on her cycle currently- using ultra tampons and changing every 1.5 hours, sees [facility]  Has not had an ultrasound of her uterus, has not had a colonoscopy, denies blood in her stools.  Patient is not on blood thinners.  Has never had a blood transfusion.  No other complaints or concerns today.  Patient would except blood in that emergency.       Home Medications Prior to Admission medications   Medication Sig Start Date End Date Taking? Authorizing Provider  acetaminophen (TYLENOL) 500 MG tablet Take 2 tablets (1,000 mg total) by mouth every 6 (six) hours as needed. 12/14/16  Yes Arby Barrette, MD  ibuprofen (ADVIL,MOTRIN) 800 MG tablet Take 1 tablet (800 mg total) by mouth every 8 (eight) hours as needed. 12/26/16  Yes Betha Loa, MD  vitamin C (ASCORBIC ACID) 500 MG tablet Take 500 mg by mouth daily.   Yes [provider]      Allergies    Sulfa antibiotics    Review of Systems   Review of Systems Negative except as per HPI Physical Exam Updated Vital Signs BP 111/70   Pulse 82    Temp 98.9 F (37.2 C)   Resp 16   Ht 5\' 6"  (1.676 m)   Wt 74.8 kg   LMP 10/03/2023   SpO2 100%   BMI 26.63 kg/m  Physical Exam Vitals and nursing note reviewed.  Constitutional:      General: She is not in acute distress.    Appearance: She is well-developed. She is not diaphoretic.  HENT:     Head: Normocephalic and atraumatic.     Mouth/Throat:     Mouth: Mucous membranes are moist.     Comments: Mucous membranes pale Eyes:     Comments: Conjunctiva pale  Cardiovascular:     Rate and Rhythm: Regular rhythm. Tachycardia present.     Heart sounds: Normal heart sounds.  Pulmonary:     Effort: Pulmonary effort is normal.     Breath sounds: Normal breath sounds.  Abdominal:     Palpations: Abdomen is soft.     Comments: Firmness to suprapubic area, suspect fibroid uterus.  Musculoskeletal:        General: No tenderness.     Cervical back: Neck supple.     Right lower leg: Edema present.     Left lower leg: Edema present.  Skin:    General: Skin is warm and dry.     Coloration: Skin is pale.  Neurological:     Mental Status: She is alert  and oriented to person, place, and time.  Psychiatric:        Behavior: Behavior normal.     ED Results / Procedures / Treatments   Labs (all labs ordered are listed, but only abnormal results are displayed) Labs Reviewed  CBC WITH DIFFERENTIAL/PLATELET - Abnormal; Notable for the following components:      Result Value   WBC 2.7 (*)    RBC 2.78 (*)    Hemoglobin 4.3 (*)    HCT 17.8 (*)    MCV 64.0 (*)    MCH 15.5 (*)    MCHC 24.2 (*)    RDW 28.4 (*)    Platelets 634 (*)    nRBC 0.7 (*)    Neutro Abs 1.1 (*)    All other components within normal limits  COMPREHENSIVE METABOLIC PANEL - Abnormal; Notable for the following components:   Calcium 8.8 (*)    AST 14 (*)    All other components within normal limits  URINALYSIS, ROUTINE W REFLEX MICROSCOPIC - Abnormal; Notable for the following components:   Hgb urine  dipstick MODERATE (*)    All other components within normal limits  RETICULOCYTES - Abnormal; Notable for the following components:   RBC. 2.78 (*)    All other components within normal limits  LIPASE, BLOOD  PREGNANCY, URINE  VITAMIN B12  FOLATE  IRON AND TIBC  FERRITIN  TYPE AND SCREEN  PREPARE RBC (CROSSMATCH)    EKG None  Radiology DG Chest Port 1 View  Result Date: 10/03/2023 CLINICAL DATA:  Leg edema.  Shortness of breath. EXAM: PORTABLE CHEST 1 VIEW COMPARISON:  11/26/2012. FINDINGS: Bilateral lung fields are clear. Bilateral lateral costophrenic angles are clear. Normal cardio-mediastinal silhouette. No acute osseous abnormalities. The soft tissues are within normal limits. IMPRESSION: No active disease. Electronically Signed   By: Jules Schick M.D.   On: 10/03/2023 14:47   CT ABDOMEN PELVIS W CONTRAST  Result Date: 10/03/2023 CLINICAL DATA:  Acute abdominal pain.  Anemia. EXAM: CT ABDOMEN AND PELVIS WITH CONTRAST TECHNIQUE: Multidetector CT imaging of the abdomen and pelvis was performed using the standard protocol following bolus administration of intravenous contrast. RADIATION DOSE REDUCTION: This exam was performed according to the departmental dose-optimization program which includes automated exposure control, adjustment of the mA and/or kV according to patient size and/or use of iterative reconstruction technique. CONTRAST:  OMNIPAQUE IOHEXOL 300 MG/ML  SOLN COMPARISON:  None Available. FINDINGS: Lower Chest: No acute findings. Hepatobiliary: No suspicious hepatic masses identified. Gallbladder is unremarkable. No evidence of biliary ductal dilatation. Pancreas:  No mass or inflammatory changes. Spleen: Within normal limits in size and appearance. Adrenals/Urinary Tract: No suspicious masses identified. No evidence of ureteral calculi or hydronephrosis. Stomach/Bowel: No evidence of obstruction, inflammatory process or abnormal fluid collections. Vascular/Lymphatic:  No pathologically enlarged lymph nodes. No acute vascular findings. Reproductive: Large fibroid is seen in the posterior uterine corpus and fundus which measures 13.8 x 9.8 x 13.3 cm. Adnexal regions are unremarkable. Other:  None. Musculoskeletal: No suspicious bone lesions identified. 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 IMPRESSION: No acute findings. Large uterine fibroid measuring 13.8 cm. Electronically Signed   By: Danae Orleans M.D.   On: 10/03/2023 14:39    Procedures .Critical Care  Performed by: Jeannie Fend, PA-C Authorized by: Jeannie Fend, PA-C   Critical care provider statement:    Critical care time (minutes):  30  Critical care was time spent personally by me on the following activities:  Development of treatment plan with patient or surrogate, discussions with consultants, evaluation of patient's response to treatment, examination of patient, ordering and review of laboratory studies, ordering and review of radiographic studies, ordering and performing treatments and interventions, pulse oximetry, re-evaluation of patient's condition and review of old charts     Medications Ordered in ED Medications  0.9 %  sodium chloride infusion (Manually program via Guardrails IV Fluids) (0 mLs Intravenous Stopped 10/03/23 1437)  iohexol (OMNIPAQUE) 300 MG/ML solution 100 mL (100 mLs Intravenous Contrast Given 10/03/23 1308)    ED Course/ Medical Decision Making/ A&P                                 Medical Decision Making Amount and/or Complexity of Data Reviewed Labs: ordered. Radiology: ordered.  Risk Prescription drug management. Decision regarding hospitalization.   This patient presents to the ED for concern of anemia, this involves an extensive number of treatment options, and is a complaint that carries with it a high risk of complications and morbidity.  The differential diagnosis  includes anemia secondary to vaginal bleeding vs mass, renal dysfunction, CHF    Co morbidities that complicate the patient evaluation  Anxiety, depression   Additional history obtained:  External records from outside source obtained and reviewed including prior labs on file from 7 years ago, hbg 10 at that time   Lab Tests:  I Ordered, and personally interpreted labs.  The pertinent results include: hCG negative.  CBC with significant anemia with hemoglobin of 4.3, also leukopenia with white count of 2.7.  Urinalysis with moderate hemoglobin likely secondary to vaginal bleeding.  CMP without significant findings today.   Imaging Studies ordered:  I ordered imaging studies including chest x-ray, CT abdomen pelvis I independently visualized and interpreted imaging which showed chest x-ray unremarkable.  CT abdomen pelvis with large fibroid uterus. I agree with the radiologist interpretation   Consultations Obtained:  I requested consultation with the ER attending, Dr. Dalene Seltzer,  and discussed lab and imaging findings as well as pertinent plan - they recommend: Agree with plan for transfusion and admission.   Problem List / ED Course / Critical interventions / Medication management  43 year old female presents emergency room with concern for bilateral lower extremity edema, seen at urgent care yesterday and advised to come to the ER for significant anemia, hemoglobin found to be 4.0 at urgent care yesterday.  She is pale, question enlarged uterus, possibly secondary to fibroids, confirmed on CT today.  Hemoglobin is low at 4.3, no recent priors on file for comparison, vitals stable.  She reports feeling fatigued at times without significant other symptoms.  Plan is to transfuse and admit. Care signed out to oncoming provider at change of shift awaiting consult with hospitalist.  I ordered medication including packed red blood cells for anemia Reevaluation of the patient after these  medicines showed that the patient stayed the same I have reviewed the patients home medicines and have made adjustments as needed   Social Determinants of Health:  Has gynecologist   Test / Admission - Considered:  Admit         Final Clinical Impression(s) / ED Diagnoses Final diagnoses:  Anemia, unspecified type    Rx / DC Orders ED Discharge Orders     None         Army Melia  A, PA-C 10/03/23 1510    Alvira Monday, MD 10/03/23 2319

## 2023-10-03 NOTE — ED Notes (Signed)
Blood bank called to state blood is ready for transfusing.

## 2023-10-03 NOTE — Progress Notes (Signed)
Lower extremity venous duplex completed. Please see CV Procedures for preliminary results.  Shona Simpson, RVT 10/03/23 4:21 PM

## 2023-10-03 NOTE — H&P (Addendum)
History and Physical  AMARRIA MINETTE HKV:[id number] DOB: Aug 26, 1981 DOA: 10/03/2023  PCP: Patient, No Pcp Per   Chief Complaint: Vaginal bleeding, leg swelling  HPI: Olivia Alvarez is a 42 y.o. female without significant past medical history not on any prescription medications being admitted to the hospital with blood loss anemia.  Patient states she has had bilateral leg swelling for the last 3 or 4 months, without pain, with some mild associated shortness of breath, swelling seems to be equal, and usually worse at the end of the day.  About 4 months ago, they drove to [place] for vacation.  She denies any chest pain, pleurisy, cough, or orthopnea.  States that she otherwise did have a little bit of subjective shortness of breath intermittently since her leg started to swell.  Over the last 3 months, she has also noticed significant heavy vaginal bleeding with her periods, to the point that she is having to change pad/tampon nearly every hour throughout the day.  She has also had a little bit of worse cramps than usual.  She has never had heavy periods like this before, denies recent weight loss, use of any blood thinners.  This past month, she also noticed that she was bleeding in between her normal cycle, which again is uncommon for her.  Went to her PCP with the above complaints of leg swelling, lab work showed hemoglobin of 4.4 so she was told to come to the ER for evaluation.  ED Course: Vital signs in the ER are normal and unremarkable.  CBC significant for WBC 2.7, hemoglobin 4.3, platelets 634.  CMP is unremarkable.  Review of Systems: Please see HPI for pertinent positives and negatives. A complete 10 system review of systems are otherwise negative.  Past Medical History:  Diagnosis Date   Anxiety    Dental crown present    Depression    Distal radius fracture, right 12/2016   displaced/comminuted   History of MRSA infection    arm   Past Surgical History:  Procedure  Laterality Date   OPEN REDUCTION INTERNAL FIXATION (ORIF) DISTAL RADIAL FRACTURE Right 12/26/2016   Procedure: OPEN REDUCTION INTERNAL FIXATION (ORIF) DISTAL RADIAL FRACTURE;  Surgeon: Betha Loa, MD;  Location: [facility];  Service: Orthopedics;  Laterality: Right;   TUBAL LIGATION Bilateral 02/15/2016   Procedure: POST PARTUM TUBAL LIGATION;  Surgeon: Willodean Rosenthal, MD;  Location: [facility];  Service: Gynecology;  Laterality: Bilateral;    Social History:  reports that she has never smoked. She has never used smokeless tobacco. She reports current alcohol use. She reports that she does not use drugs.   Allergies  Allergen Reactions   Sulfa Antibiotics Other (See Comments)    UNKNOWN    Family History  Problem Relation Age of Onset   Cancer Father        throat   Heart disease Maternal Grandmother    Cancer Maternal Grandfather    Heart disease Maternal Grandfather    Diabetes Maternal Grandfather    Cancer Paternal Grandfather        lung     Prior to Admission medications   Medication Sig Start Date End Date Taking? Authorizing Provider  acetaminophen (TYLENOL) 500 MG tablet Take 2 tablets (1,000 mg total) by mouth every 6 (six) hours as needed. 12/14/16  Yes Arby Barrette, MD  ibuprofen (ADVIL,MOTRIN) 800 MG tablet Take 1 tablet (800 mg total) by mouth every 8 (eight) hours as needed. 12/26/16  Yes Betha Loa,  MD  vitamin C (ASCORBIC ACID) 500 MG tablet Take 500 mg by mouth daily.   Yes [provider]    Physical Exam: BP 109/69   Pulse 83   Temp 99.4 F (37.4 C) (Oral)   Resp 16   Ht 5\' 6"  (1.676 m)   Wt 74.8 kg   LMP 10/03/2023   SpO2 100%   BMI 26.63 kg/m   General:  Alert, oriented, calm, in no acute distress  Eyes: EOMI, pale conjuctivae, white sclerea Neck: supple, no masses, trachea mildline  Cardiovascular: RRR, no murmurs or rubs Respiratory: clear to auscultation bilaterally, no wheezes, no crackles  Abdomen: soft,  nontender, right lower quadrant fullness, normal bowel tones heard  Skin: dry, no rashes  Musculoskeletal: no joint effusions, normal range of motion, she has 2-3+ pitting bilateral lower extremity edema up to the mid calf, no associated tenderness or erythema Psychiatric: appropriate affect, normal speech  Neurologic: extraocular muscles intact, clear speech, moving all extremities with intact sensorium         Labs on Admission:  Basic Metabolic Panel: Recent Labs  Lab 10/03/23 1115  NA 138  K 4.0  CL 106  CO2 25  GLUCOSE 96  BUN 11  CREATININE 0.60  CALCIUM 8.8*   Liver Function Tests: Recent Labs  Lab 10/03/23 1115  AST 14*  ALT 9  ALKPHOS 45  BILITOT 0.5  PROT 7.1  ALBUMIN 4.2   Recent Labs  Lab 10/03/23 1115  LIPASE 23   No results for input(s): "AMMONIA" in the last 168 hours. CBC: Recent Labs  Lab 10/03/23 1115  WBC 2.7*  NEUTROABS 1.1*  HGB 4.3*  HCT 17.8*  MCV 64.0*  PLT 634*   Cardiac Enzymes: No results for input(s): "CKTOTAL", "CKMB", "CKMBINDEX", "TROPONINI" in the last 168 hours.  BNP (last 3 results) No results for input(s): "BNP" in the last 8760 hours.  ProBNP (last 3 results) No results for input(s): "PROBNP" in the last 8760 hours.  CBG: No results for input(s): "GLUCAP" in the last 168 hours.  Radiological Exams on Admission: DG Chest Port 1 View  Result Date: 10/03/2023 CLINICAL DATA:  Leg edema.  Shortness of breath. EXAM: PORTABLE CHEST 1 VIEW COMPARISON:  11/26/2012. FINDINGS: Bilateral lung fields are clear. Bilateral lateral costophrenic angles are clear. Normal cardio-mediastinal silhouette. No acute osseous abnormalities. The soft tissues are within normal limits. IMPRESSION: No active disease. Electronically Signed   By: Jules Schick M.D.   On: 10/03/2023 14:47   CT ABDOMEN PELVIS W CONTRAST  Result Date: 10/03/2023 CLINICAL DATA:  Acute abdominal pain.  Anemia. EXAM: CT ABDOMEN AND PELVIS WITH CONTRAST TECHNIQUE:  Multidetector CT imaging of the abdomen and pelvis was performed using the standard protocol following bolus administration of intravenous contrast. RADIATION DOSE REDUCTION: This exam was performed according to the departmental dose-optimization program which includes automated exposure control, adjustment of the mA and/or kV according to patient size and/or use of iterative reconstruction technique. CONTRAST:  OMNIPAQUE IOHEXOL 300 MG/ML  SOLN COMPARISON:  None Available. FINDINGS: Lower Chest: No acute findings. Hepatobiliary: No suspicious hepatic masses identified. Gallbladder is unremarkable. No evidence of biliary ductal dilatation. Pancreas:  No mass or inflammatory changes. Spleen: Within normal limits in size and appearance. Adrenals/Urinary Tract: No suspicious masses identified. No evidence of ureteral calculi or hydronephrosis. Stomach/Bowel: No evidence of obstruction, inflammatory process or abnormal fluid collections. Vascular/Lymphatic: No pathologically enlarged lymph nodes. No acute vascular findings. Reproductive: Large fibroid is seen in the  posterior uterine corpus and fundus which measures 13.8 x 9.8 x 13.3 cm. Adnexal regions are unremarkable. Other:  None. Musculoskeletal: No suspicious bone lesions identified. 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 IMPRESSION: No acute findings. Large uterine fibroid measuring 13.8 cm. Electronically Signed   By: Danae Orleans M.D.   On: 10/03/2023 14:39    Assessment/Plan This is a pleasant and healthy 41 year old female without significant past medical history on no prescription medications being admitted to the hospital with acute blood loss anemia due to uterine bleeding.  Acute blood loss anemia-due to uterine bleeding, likely due to large fibroid -Observation admission -Avoid blood thinners -Check INR -Transfuse total 3 units PRBC -Trend hemoglobin tonight,  and with morning labs  Large uterine fibroid with bleeding-Discussed with her gynecologist Dr. Queen Blossom -Recommends close outpatient follow-up for likely hysterectomy  Lower extremity edema-unclear etiology, given the fact that it started after road trip to [place] I would like to rule out DVT, though she does not have other risk factors, and the edema is nontender and bilateral.  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 -Check bilateral lower extremity Doppler -Elevate legs when not ambulating  Leukopenia-etiology of this is unclear, possibly due to acute bleeding, will trend with daily labs  DVT prophylaxis: SCDs     Code Status: Full Code  Consults called: Discussed with Gyn as above.  Admission status: Observation  Time spent: 49 minutes  Olivia Coral Sharlette Dense MD [facility] Pager [phone]  If 7PM-7AM, please contact night-coverage [email or web address] Password [facility]  10/03/2023, 3:44 PM

## 2023-10-03 NOTE — ED Notes (Signed)
ED TO INPATIENT HANDOFF REPORT  Name/Age/Gender Olivia Alvarez 42 y.o. female  Code Status    Code Status Orders  (From admission, onward)           Start     Ordered   10/03/23 1543  Full code  Continuous       Question:  By:  Answer:  Consent: discussion documented in EHR   10/03/23 1543           Code Status History     Date Active Date Inactive Code Status Order ID Comments User Context   02/15/2016 0847 02/17/2016 1639 Full Code [id number]  Arianda, [place], CNM Inpatient   02/14/2016 2359 02/15/2016 0847 Full Code [id number]  Arabella Merles, CNM Inpatient       Home/SNF/Other Home  Chief Complaint ABLA (acute blood loss anemia) [D62]  Level of Care/Admitting Diagnosis ED Disposition     ED Disposition  Admit   Condition  --   Comment  Hospital Area: [facility] [[id number]]  Level of Care: Med-Surg [16]  May place patient in observation at [facility] or [facility] if equivalent level of care is available:: Yes  Covid Evaluation: Asymptomatic - no recent exposure (last 10 days) testing not required  Diagnosis: ABLA (acute blood loss anemia) [7829562]  Admitting Physician: Maryln Gottron [[id number]]  Attending Physician: Olexa.Dam, MIR Jaxson.Roy [[id number]]          Medical History Past Medical History:  Diagnosis Date   Anxiety    Dental crown present    Depression    Distal radius fracture, right 12/2016   displaced/comminuted   History of MRSA infection    arm    Allergies Allergies  Allergen Reactions   Sulfa Antibiotics Other (See Comments)    UNKNOWN    IV Location/Drains/Wounds Patient Lines/Drains/Airways Status     Active Line/Drains/Airways     Name Placement date Placement time Site Days   Peripheral IV 10/03/23 18 G Left Antecubital 10/03/23  1117  Antecubital  less than 1   Incision (Closed) 12/26/16 Wrist Right 12/26/16  1730  -- 2472            Labs/Imaging Results for orders placed or performed  during the hospital encounter of 10/03/23 (from the past 48 hour(s))  CBC with Differential     Status: Abnormal   Collection Time: 10/03/23 11:15 AM  Result Value Ref Range   WBC 2.7 (L) 4.0 - 10.5 K/uL   RBC 2.78 (L) 3.87 - 5.11 MIL/uL   Hemoglobin 4.3 (LL) 12.0 - 15.0 g/dL    Comment: Reticulocyte Hemoglobin testing may be clinically indicated, consider ordering this additional test [id number] THIS CRITICAL RESULT HAS VERIFIED AND BEEN CALLED TO Donald Memoli, M BY LINDSAY,CELESTE ON 10 18 2024 AT 1202, AND HAS BEEN READ BACK.     HCT 17.8 (L) 36.0 - 46.0 %   MCV 64.0 (L) 80.0 - 100.0 fL   MCH 15.5 (L) 26.0 - 34.0 pg   MCHC 24.2 (L) 30.0 - 36.0 g/dL   RDW 40.1 (H) 02.7 - 25.3 %   Platelets 634 (H) 150 - 400 K/uL   nRBC 0.7 (H) 0.0 - 0.2 %   Neutrophils Relative % 43 %   Neutro Abs 1.1 (L) 1.7 - 7.7 K/uL   Lymphocytes Relative 40 %   Lymphs Abs 1.1 0.7 - 4.0 K/uL   Monocytes Relative 10 %   Monocytes Absolute 0.3 0.1 - 1.0 K/uL  Eosinophils Relative 6 %   Eosinophils Absolute 0.2 0.0 - 0.5 K/uL   Basophils Relative 1 %   Basophils Absolute 0.0 0.0 - 0.1 K/uL   Immature Granulocytes 0 %   Abs Immature Granulocytes 0.01 0.00 - 0.07 K/uL   Ovalocytes PRESENT     Comment: Performed at [facility], [address], [place], [place] [address]  Comprehensive metabolic panel     Status: Abnormal   Collection Time: 10/03/23 11:15 AM  Result Value Ref Range   Sodium 138 135 - 145 mmol/L   Potassium 4.0 3.5 - 5.1 mmol/L   Chloride 106 98 - 111 mmol/L   CO2 25 22 - 32 mmol/L   Glucose, Bld 96 70 - 99 mg/dL    Comment: Glucose reference range applies only to samples taken after fasting for at least 8 hours.   BUN 11 6 - 20 mg/dL   Creatinine, Ser 6.04 0.44 - 1.00 mg/dL   Calcium 8.8 (L) 8.9 - 10.3 mg/dL   Total Protein 7.1 6.5 - 8.1 g/dL   Albumin 4.2 3.5 - 5.0 g/dL   AST 14 (L) 15 - 41 U/L   ALT 9 0 - 44 U/L   Alkaline Phosphatase 45 38 - 126 U/L   Total Bilirubin  0.5 0.3 - 1.2 mg/dL   GFR, Estimated >54 >09 mL/min    Comment: (NOTE) Calculated using the CKD-EPI Creatinine Equation (2021)    Anion gap 7 5 - 15    Comment: Performed at [facility], [address] [place], [place] [address]  Lipase, blood     Status: None   Collection Time: 10/03/23 11:15 AM  Result Value Ref Range   Lipase 23 11 - 51 U/L    Comment: Performed at [facility], [address] [place], [place] [address]  Urinalysis, Routine w reflex microscopic -Urine, Clean Catch     Status: Abnormal   Collection Time: 10/03/23 11:15 AM  Result Value Ref Range   Color, Urine YELLOW YELLOW   APPearance CLEAR CLEAR   Specific Gravity, Urine 1.019 1.005 - 1.030   pH 5.0 5.0 - 8.0   Glucose, UA NEGATIVE NEGATIVE mg/dL   Hgb urine dipstick MODERATE (A) NEGATIVE   Bilirubin Urine NEGATIVE NEGATIVE   Ketones, ur NEGATIVE NEGATIVE mg/dL   Protein, ur NEGATIVE NEGATIVE mg/dL   Nitrite NEGATIVE NEGATIVE   Leukocytes,Ua NEGATIVE NEGATIVE   RBC / HPF 21-50 0 - 5 RBC/hpf   WBC, UA 0-5 0 - 5 WBC/hpf   Bacteria, UA NONE SEEN NONE SEEN   Squamous Epithelial / HPF 0-5 0 - 5 /HPF   Mucus PRESENT     Comment: Performed at [facility], [address] [place], [place] [address]  Pregnancy, urine     Status: None   Collection Time: 10/03/23 11:15 AM  Result Value Ref Range   Preg Test, Ur NEGATIVE NEGATIVE    Comment:        THE SENSITIVITY OF THIS METHODOLOGY IS >25 mIU/mL. Performed at [facility], [address], [place], [place] [address]   Type and screen [facility]     Status: None (Preliminary result)   Collection Time: 10/03/23 11:15 AM  Result Value Ref Range   ABO/RH(D) O NEG    Antibody Screen NEG    Sample Expiration 10/06/2023,2359    Unit Number [id number]    Blood Component Type RED CELLS,LR  Unit division 00    Status of Unit ISSUED    Transfusion Status OK TO  TRANSFUSE    Crossmatch Result Compatible    Unit Number [id number]    Blood Component Type RED CELLS,LR    Unit division 00    Status of Unit ISSUED    Transfusion Status OK TO TRANSFUSE    Crossmatch Result      Compatible Performed at [facility], [address] [place], [place] [address]   Reticulocytes     Status: Abnormal   Collection Time: 10/03/23 11:15 AM  Result Value Ref Range   Retic Ct Pct 0.8 0.4 - 3.1 %   RBC. 2.78 (L) 3.87 - 5.11 MIL/uL   Retic Count, Absolute 23.1 19.0 - 186.0 K/uL   Immature Retic Fract 6.9 2.3 - 15.9 %    Comment: Performed at [facility], [address] [place], [place] [address]  Prepare RBC (crossmatch)     Status: None   Collection Time: 10/03/23 12:16 PM  Result Value Ref Range   Order Confirmation      ORDER PROCESSED BY BLOOD BANK Performed at [facility], [address], [place], [place] [address]   Folate     Status: None   Collection Time: 10/03/23  1:36 PM  Result Value Ref Range   Folate 6.5 >5.9 ng/mL    Comment: Performed at [facility], [address], [place], [place] [address]   DG Chest Port 1 View  Result Date: 10/03/2023 CLINICAL DATA:  Leg edema.  Shortness of breath. EXAM: PORTABLE CHEST 1 VIEW COMPARISON:  11/26/2012. FINDINGS: Bilateral lung fields are clear. Bilateral lateral costophrenic angles are clear. Normal cardio-mediastinal silhouette. No acute osseous abnormalities. The soft tissues are within normal limits. IMPRESSION: No active disease. Electronically Signed   By: Jules Schick M.D.   On: 10/03/2023 14:47   CT ABDOMEN PELVIS W CONTRAST  Result Date: 10/03/2023 CLINICAL DATA:  Acute abdominal pain.  Anemia. EXAM: CT ABDOMEN AND PELVIS WITH CONTRAST TECHNIQUE: Multidetector CT imaging of the abdomen and pelvis was performed using the standard protocol following bolus administration of intravenous contrast. RADIATION DOSE  REDUCTION: This exam was performed according to the departmental dose-optimization program which includes automated exposure control, adjustment of the mA and/or kV according to patient size and/or use of iterative reconstruction technique. CONTRAST:  OMNIPAQUE IOHEXOL 300 MG/ML  SOLN COMPARISON:  None Available. FINDINGS: Lower Chest: No acute findings. Hepatobiliary: No suspicious hepatic masses identified. Gallbladder is unremarkable. No evidence of biliary ductal dilatation. Pancreas:  No mass or inflammatory changes. Spleen: Within normal limits in size and appearance. Adrenals/Urinary Tract: No suspicious masses identified. No evidence of ureteral calculi or hydronephrosis. Stomach/Bowel: No evidence of obstruction, inflammatory process or abnormal fluid collections. Vascular/Lymphatic: No pathologically enlarged lymph nodes. No acute vascular findings. Reproductive: Large fibroid is seen in the posterior uterine corpus and fundus which measures 13.8 x 9.8 x 13.3 cm. Adnexal regions are unremarkable. Other:  None. Musculoskeletal: No suspicious bone lesions identified. 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 IMPRESSION: No acute findings. Large uterine fibroid measuring 13.8 cm. Electronically Signed   By: Danae Orleans M.D.   On: 10/03/2023 14:39    Pending Labs Unresulted Labs (From admission, onward)     Start     Ordered   10/04/23 0500  CBC  Daily,   R  10/03/23 1602   10/03/23 2100  Hemoglobin and hematocrit, blood  Once-Timed,   TIMED        10/03/23 1544   10/03/23 1601  Protime-INR  Add-on,   AD        10/03/23 1600   10/03/23 1544  Prepare RBC (crossmatch)  (Blood Administration Adult)  Once,   R       Question Answer Comment  # of Units 1 unit   Transfusion Indications Hemoglobin < 7 gm/dL and symptomatic   Number of Units to Keep Ahead NO units ahead   If emergent release call blood bank Not  emergent release      10/03/23 1544   10/03/23 1543  HIV Antibody (routine testing w rflx)  (HIV Antibody (Routine testing w reflex) panel)  Once,   R        10/03/23 1543   10/03/23 1217  Vitamin B12  (Anemia Panel (PNL))  Once,   URGENT        10/03/23 1216   10/03/23 1217  Iron and TIBC  (Anemia Panel (PNL))  Once,   URGENT        10/03/23 1216   10/03/23 1217  Ferritin  (Anemia Panel (PNL))  Once,   URGENT        10/03/23 1216            Vitals/Pain Today's Vitals   10/03/23 1430 10/03/23 1505 10/03/23 1525 10/03/23 1540  BP: 97/66 111/70 109/69 105/67  Pulse: 74 82 83 89  Resp: 18 16 16 17   Temp:  98.9 F (37.2 C) 99.4 F (37.4 C) 99.3 F (37.4 C)  TempSrc:   Oral   SpO2: 100% 100% 100% 100%  Weight:      Height:      PainSc:        Isolation Precautions No active isolations  Medications Medications  acetaminophen (TYLENOL) tablet 650 mg (has no administration in time range)    Or  acetaminophen (TYLENOL) suppository 650 mg (has no administration in time range)  traZODone (DESYREL) tablet 25 mg (has no administration in time range)  ondansetron (ZOFRAN) tablet 4 mg (has no administration in time range)    Or  ondansetron (ZOFRAN) injection 4 mg (has no administration in time range)  albuterol (PROVENTIL) (2.5 MG/3ML) 0.083% nebulizer solution 2.5 mg (has no administration in time range)  0.9 %  sodium chloride infusion (Manually program via Guardrails IV Fluids) (0 mLs Intravenous Stopped 10/03/23 1437)  iohexol (OMNIPAQUE) 300 MG/ML solution 100 mL (100 mLs Intravenous Contrast Given 10/03/23 1308)  0.9 %  sodium chloride infusion (Manually program via Guardrails IV Fluids) ( Intravenous New Bag/Given 10/03/23 1547)    Mobility walks

## 2023-10-04 DIAGNOSIS — D259 Leiomyoma of uterus, unspecified: Secondary | ICD-10-CM | POA: Diagnosis present

## 2023-10-04 DIAGNOSIS — N939 Abnormal uterine and vaginal bleeding, unspecified: Secondary | ICD-10-CM | POA: Diagnosis present

## 2023-10-04 LAB — CBC
HCT: 26.7 % — ABNORMAL LOW (ref 36.0–46.0)
Hemoglobin: 7.5 g/dL — ABNORMAL LOW (ref 12.0–15.0)
MCH: 19.9 pg — ABNORMAL LOW (ref 26.0–34.0)
MCHC: 28.1 g/dL — ABNORMAL LOW (ref 30.0–36.0)
MCV: 70.8 fL — ABNORMAL LOW (ref 80.0–100.0)
Platelets: 513 10*3/uL — ABNORMAL HIGH (ref 150–400)
RBC: 3.77 MIL/uL — ABNORMAL LOW (ref 3.87–5.11)
RDW: 28.8 % — ABNORMAL HIGH (ref 11.5–15.5)
WBC: 4.4 10*3/uL (ref 4.0–10.5)
nRBC: 0.9 % — ABNORMAL HIGH (ref 0.0–0.2)

## 2023-10-04 LAB — HEMOGLOBIN AND HEMATOCRIT, BLOOD
HCT: 25.9 % — ABNORMAL LOW (ref 36.0–46.0)
Hemoglobin: 7.3 g/dL — ABNORMAL LOW (ref 12.0–15.0)

## 2023-10-04 LAB — HIV ANTIBODY (ROUTINE TESTING W REFLEX): HIV Screen 4th Generation wRfx: NONREACTIVE

## 2023-10-04 LAB — PROTIME-INR
INR: 1.2 (ref 0.8–1.2)
Prothrombin Time: 15 s (ref 11.4–15.2)

## 2023-10-04 MED ORDER — FERROUS SULFATE 325 (65 FE) MG PO TABS: 325.0000 mg | ORAL_TABLET | Freq: Two times a day (BID) | ORAL | Status: DC

## 2023-10-04 MED ORDER — FERROUS SULFATE 325 (65 FE) MG PO TABS: 325.0000 mg | ORAL_TABLET | Freq: Two times a day (BID) | ORAL | 1 refills | Status: AC

## 2023-10-04 MED ORDER — SODIUM CHLORIDE 0.9 % IV SOLN
200.0000 mg | Freq: Once | INTRAVENOUS | Status: AC
  Administered 2023-10-04: 200 mg via INTRAVENOUS
  Filled 2023-10-04: qty 10

## 2023-10-04 NOTE — Plan of Care (Addendum)
Patient is alert and oriented, discharge paperwork went over with patient and husband present at the bedside. Patient received iron infusion prior to discharge without any complications or symptoms. PIV flushed and removed, and husband to transport patient home. No further needs at this time.  Problem: Education: Goal: Knowledge of General Education information will improve Description: Including pain rating scale, medication(s)/side effects and non-pharmacologic comfort measures Outcome: Completed/Met   Problem: Health Behavior/Discharge Planning: Goal: Ability to manage health-related needs will improve Outcome: Completed/Met   Problem: Clinical Measurements: Goal: Ability to maintain clinical measurements within normal limits will improve Outcome: Completed/Met Goal: Will remain free from infection Outcome: Completed/Met Goal: Diagnostic test results will improve Outcome: Completed/Met Goal: Respiratory complications will improve Outcome: Completed/Met Goal: Cardiovascular complication will be avoided Outcome: Completed/Met   Problem: Activity: Goal: Risk for activity intolerance will decrease Outcome: Completed/Met   Problem: Nutrition: Goal: Adequate nutrition will be maintained Outcome: Completed/Met   Problem: Coping: Goal: Level of anxiety will decrease Outcome: Completed/Met   Problem: Elimination: Goal: Will not experience complications related to bowel motility Outcome: Completed/Met Goal: Will not experience complications related to urinary retention Outcome: Completed/Met   Problem: Pain Managment: Goal: General experience of comfort will improve Outcome: Completed/Met   Problem: Safety: Goal: Ability to remain free from injury will improve Outcome: Completed/Met   Problem: Skin Integrity: Goal: Risk for impaired skin integrity will decrease Outcome: Completed/Met

## 2023-10-04 NOTE — Plan of Care (Signed)

## 2023-10-04 NOTE — Hospital Course (Signed)
42 year old female presenting to [facility] emergency department with several months of extremely heavy abnormal uterine bleeding as well as progressive weakness lightheadedness and shortness of breath.  Patient was sent to the emergency department from her outpatient providers office after she was found to have a hemoglobin of 4.4.  Upon evaluation in the emergency department patient was found to have a hemoglobin of 4.3 thought to be secondary to abnormal uterine bleeding.  The hospitalist group was then called to assess the patient for admission the hospital.  Patient was typed and crossmatched and transfused 3 units of packed red blood cells successfully with proper increase in hemoglobin to 7.5.  Case was discussed with Dr. Queen Blossom the patient's outpatient gynecologist who recommended close outpatient follow-up on November 5 for consideration of hysterectomy.  Patient symptoms dramatically improved after the transfusion.  Patient was additionally given a dose of intravenous Venofer infusion prior to discharge.  At time of discharge patient was placed on ferrous sulfate by mouth twice daily and given his specific instructions to take each dose on empty stomach with orange juice.  Patient was advised to return to the emergency department if she develops worsening shortness of breath or chest pain or weakness.  Patient was sent home in improved and stable condition on 10/19.

## 2023-10-04 NOTE — Discharge Instructions (Signed)
Please maintain your outpatient follow-up appointment with Dr. Davonna Belling on November 5 as previously scheduled. Please take your ferrous sulfate tablets as instructed, taking them approximately 20 to 30 minutes before breakfast and dinner with a small cup of orange juice if possible. You may consume a regular diet, although please avoid alcohol use.   Please also avoid use of Aspirin and NSAIDs such as naproxen, Aleve, ibuprofen and Advil.  Try to only use Tylenol for pain control this point. Keep your legs elevated is much as possible and attempt to sleep on your side at night to try and keep the swelling down. If you develop worsening shortness of breath, chest pain, lightheadedness, episodes of loss of consciousness or worsening uncontrollable bleeding return to the emergency department.

## 2023-10-04 NOTE — Discharge Summary (Addendum)
Physician Discharge Summary   Patient: Olivia Alvarez MRN: [id number] DOB: [birth year]  Admit date:     10/03/2023  Discharge date: 10/04/23  Discharge Physician: Marinda Elk   PCP: Patient, No Pcp Per   Recommendations at discharge:    Please maintain your outpatient follow-up appointment with Dr. Davonna Belling on November 5 as previously scheduled. Please take your ferrous sulfate tablets as instructed, taking them approximately 20 to 30 minutes before breakfast and dinner with a small cup of orange juice if possible. You may consume a regular diet, although please avoid alcohol use.   Please also avoid use of Aspirin and NSAIDs such as naproxen, Aleve, ibuprofen and Advil.  Try to only use Tylenol for pain control this point. Keep your legs elevated is much as possible and attempt to sleep on your side at night to try and keep the swelling down. If you develop worsening shortness of breath, chest pain, lightheadedness, episodes of loss of consciousness or worsening uncontrollable bleeding return to the emergency department.    Discharge Diagnoses: Principal Problem:   Acute blood loss anemia Active Problems:   Abnormal uterine bleeding (AUB)   Uterine fibroid  Resolved Problems:   * No resolved hospital problems. *   Hospital Course: 42 year old female presenting to [facility] emergency department with several months of extremely heavy abnormal uterine bleeding as well as progressive weakness lightheadedness and shortness of breath.  Patient was sent to the emergency department from her outpatient providers office after she was found to have a hemoglobin of 4.4.  Upon evaluation in the emergency department patient was found to have a hemoglobin of 4.3 thought to be secondary to abnormal uterine bleeding.  The hospitalist group was then called to assess the patient for admission the hospital.  Patient was typed and crossmatched and transfused 3 units of packed  red blood cells successfully with proper increase in hemoglobin to 7.5.  Case was discussed with Dr. Queen Blossom the patient's outpatient gynecologist who recommended close outpatient follow-up on November 5 for consideration of hysterectomy.  Patient symptoms dramatically improved after the transfusion.  Patient was additionally given a dose of intravenous Venofer infusion prior to discharge.  At time of discharge patient was placed on ferrous sulfate by mouth twice daily and given his specific instructions to take each dose on empty stomach with orange juice.  Patient was advised to return to the emergency department if she develops worsening shortness of breath or chest pain or weakness.  Patient was sent home in improved and stable condition on 10/19.  Consultants: Review of case via phone with Dr. Maryruth Hancock with Gynecology Procedures performed: NOne Disposition: Home Diet recommendation:  Discharge Diet Orders (From admission, onward)     Start     Ordered   10/04/23 0000  Diet - low sodium heart healthy        10/04/23 1338           Regular diet  DISCHARGE MEDICATION: Allergies as of 10/04/2023       Reactions   Sulfa Antibiotics Other (See Comments)   UNKNOWN        Medication List     STOP taking these medications    ibuprofen 800 MG tablet Commonly known as: ADVIL       TAKE these medications    acetaminophen 500 MG tablet Commonly known as: TYLENOL Take 2 tablets (1,000 mg total) by mouth every 6 (six) hours as needed.   ascorbic acid 500 MG tablet  Commonly known as: VITAMIN C Take 500 mg by mouth daily.   ferrous sulfate 325 (65 FE) MG tablet Take 1 tablet (325 mg total) by mouth 2 (two) times daily before a meal. Start taking on: October 05, 2023        Follow-up Information     Willa Frater, MD Follow up on 10/21/2023.   Specialty: Obstetrics and Gynecology Contact information: [address] [place]  [address] [phone]                 Discharge Exam: Ceasar Mons Weights   10/03/23 1026 10/03/23 1654  Weight: 74.8 kg 68.9 kg    Constitutional: Awake alert and oriented x3, no associated distress.   Respiratory: clear to auscultation bilaterally, no wheezing, no crackles. Normal respiratory effort. No accessory muscle use.  Cardiovascular: Regular rate and rhythm, no murmurs / rubs / gallops. No extremity edema. 2+ pedal pulses. No carotid bruits.  Abdomen: Abdomen is soft. Palpable lower abdominal mass secondary to known fibroid.  Mild associated tenderness.  Positive bowel sounds noted in all quadrants.   Musculoskeletal: No joint deformity upper and lower extremities. Good ROM, no contractures. Normal muscle tone.     Condition at discharge: fair  The results of significant diagnostics from this hospitalization (including imaging, microbiology, ancillary and laboratory) are listed below for reference.   Imaging Studies: VAS [place] LOWER EXTREMITY VENOUS (DVT)  Result Date: 10/03/2023  Lower Venous DVT Study Patient Name:  JAVAYAH HALDEMAN  Date of Exam:   10/03/2023 Medical Rec #: [id number]             Accession #:    [id number] Date of Birth: 1981/06/06             Patient Gender: F Patient Age:   42 years Exam Location:  [facility] Procedure:      VAS [place] LOWER EXTREMITY VENOUS (DVT) Referring Phys: [facility] --------------------------------------------------------------------------------  Indications: Swelling.  Risk Factors: None identified. Comparison Study: No prior study Performing Technologist: Shona Simpson  Examination Guidelines: A complete evaluation includes B-mode imaging, spectral Doppler, color Doppler, and power Doppler as needed of all accessible portions of each vessel. Bilateral testing is considered an integral part of a complete examination. Limited examinations for reoccurring indications may be performed as noted. The reflux portion of the exam is  performed with the patient in reverse Trendelenburg.  +---------+---------------+---------+-----------+----------+--------------+ RIGHT    CompressibilityPhasicitySpontaneityPropertiesThrombus Aging +---------+---------------+---------+-----------+----------+--------------+ CFV      Full           Yes      Yes                                 +---------+---------------+---------+-----------+----------+--------------+ SFJ      Full                                                        +---------+---------------+---------+-----------+----------+--------------+ FV Prox  Full                                                        +---------+---------------+---------+-----------+----------+--------------+  FV Mid   Full                                                        +---------+---------------+---------+-----------+----------+--------------+ FV DistalFull                                                        +---------+---------------+---------+-----------+----------+--------------+ PFV      Full                                                        +---------+---------------+---------+-----------+----------+--------------+ POP      Full           Yes      Yes                                 +---------+---------------+---------+-----------+----------+--------------+ PTV      Full                                                        +---------+---------------+---------+-----------+----------+--------------+ PERO     Full                                                        +---------+---------------+---------+-----------+----------+--------------+   +---------+---------------+---------+-----------+----------+--------------+ LEFT     CompressibilityPhasicitySpontaneityPropertiesThrombus Aging +---------+---------------+---------+-----------+----------+--------------+ CFV      Full           Yes      Yes                                  +---------+---------------+---------+-----------+----------+--------------+ SFJ      Full                                                        +---------+---------------+---------+-----------+----------+--------------+ FV Prox  Full                                                        +---------+---------------+---------+-----------+----------+--------------+ FV Mid   Full                                                        +---------+---------------+---------+-----------+----------+--------------+  FV DistalFull                                                        +---------+---------------+---------+-----------+----------+--------------+ PFV      Full                                                        +---------+---------------+---------+-----------+----------+--------------+ POP      Full           Yes      Yes                                 +---------+---------------+---------+-----------+----------+--------------+ PTV      Full                                                        +---------+---------------+---------+-----------+----------+--------------+ PERO     Full                                                        +---------+---------------+---------+-----------+----------+--------------+     Summary: BILATERAL: - No evidence of deep vein thrombosis seen in the lower extremities, bilaterally. -No evidence of popliteal cyst, bilaterally.   *See table(s) above for measurements and observations. Electronically signed by Sherald Hess MD on 10/03/2023 at 4:25:47 PM.    Final    DG Chest Port 1 View  Result Date: 10/03/2023 CLINICAL DATA:  Leg edema.  Shortness of breath. EXAM: PORTABLE CHEST 1 VIEW COMPARISON:  11/26/2012. FINDINGS: Bilateral lung fields are clear. Bilateral lateral costophrenic angles are clear. Normal cardio-mediastinal silhouette. No acute osseous abnormalities. The soft tissues are within normal  limits. IMPRESSION: No active disease. Electronically Signed   By: Jules Schick M.D.   On: 10/03/2023 14:47   CT ABDOMEN PELVIS W CONTRAST  Result Date: 10/03/2023 CLINICAL DATA:  Acute abdominal pain.  Anemia. EXAM: CT ABDOMEN AND PELVIS WITH CONTRAST TECHNIQUE: Multidetector CT imaging of the abdomen and pelvis was performed using the standard protocol following bolus administration of intravenous contrast. RADIATION DOSE REDUCTION: This exam was performed according to the departmental dose-optimization program which includes automated exposure control, adjustment of the mA and/or kV according to patient size and/or use of iterative reconstruction technique. CONTRAST:  OMNIPAQUE IOHEXOL 300 MG/ML  SOLN COMPARISON:  None Available. FINDINGS: Lower Chest: No acute findings. Hepatobiliary: No suspicious hepatic masses identified. Gallbladder is unremarkable. No evidence of biliary ductal dilatation. Pancreas:  No mass or inflammatory changes. Spleen: Within normal limits in size and appearance. Adrenals/Urinary Tract: No suspicious masses identified. No evidence of ureteral calculi or hydronephrosis. Stomach/Bowel: No evidence of obstruction, inflammatory process or abnormal fluid collections. Vascular/Lymphatic: No pathologically enlarged lymph nodes. No acute vascular findings. Reproductive: Large fibroid is seen in the posterior uterine corpus  and fundus which measures 13.8 x 9.8 x 13.3 cm. Adnexal regions are unremarkable. Other:  None. Musculoskeletal: No suspicious bone lesions identified. 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 IMPRESSION: No acute findings. Large uterine fibroid measuring 13.8 cm. Electronically Signed   By: Danae Orleans M.D.   On: 10/03/2023 14:39    Microbiology: Results for orders placed or performed during the hospital encounter of 02/14/16  OB RESULT CONSOLE Group B Strep     Status: None    Collection Time: 01/17/16 12:00 AM  Result Value Ref Range Status   GBS Positive  Final    Labs: CBC: Recent Labs  Lab 10/03/23 1115 10/04/23 0015 10/04/23 0620  WBC 2.7*  --  4.4  NEUTROABS 1.1*  --   --   HGB 4.3* 7.3* 7.5*  HCT 17.8* 25.9* 26.7*  MCV 64.0*  --  70.8*  PLT 634*  --  513*   Basic Metabolic Panel: Recent Labs  Lab 10/03/23 1115  NA 138  K 4.0  CL 106  CO2 25  GLUCOSE 96  BUN 11  CREATININE 0.60  CALCIUM 8.8*   Liver Function Tests: Recent Labs  Lab 10/03/23 1115  AST 14*  ALT 9  ALKPHOS 45  BILITOT 0.5  PROT 7.1  ALBUMIN 4.2   CBG: No results for input(s): "GLUCAP" in the last 168 hours.  Discharge time spent: greater than 30 minutes.  Signed: Marinda Elk, MD [facility] 10/04/2023

## 2023-10-06 LAB — TYPE AND SCREEN
ABO/RH(D): O NEG
Antibody Screen: NEGATIVE
Unit division: 0
Unit division: 0
Unit division: 0

## 2023-10-06 LAB — BPAM RBC
Blood Product Expiration Date: 202411142359
Blood Product Expiration Date: 202411142359
Blood Product Expiration Date: 202411142359
ISSUE DATE / TIME: 202410181301
ISSUE DATE / TIME: 202410181513
ISSUE DATE / TIME: 202410181838
Unit Type and Rh: 9500
Unit Type and Rh: 9500
Unit Type and Rh: 9500

## 2023-10-09 ENCOUNTER — Other Ambulatory Visit: Payer: Self-pay | Admitting: Obstetrics and Gynecology

## 2023-10-09 DIAGNOSIS — R928 Other abnormal and inconclusive findings on diagnostic imaging of breast: Secondary | ICD-10-CM

## 2023-10-13 ENCOUNTER — Telehealth: Payer: Self-pay

## 2023-10-13 NOTE — Telephone Encounter (Signed)
An email was received that pt Olivia Alvarez for [employer]. It is unclear what assistance the pt needs but I have left her a message requesting she return the call to our main number if she is wanting to schedule an appt with [facility] so we can pay for her dx mammo ordered by Dr. Clint Lipps. I request that the pt provide more detail as to how we can help her if scheduling an appt is not the reason for her call.

## 2023-10-14 ENCOUNTER — Other Ambulatory Visit: Payer: Self-pay

## 2023-10-14 DIAGNOSIS — R928 Other abnormal and inconclusive findings on diagnostic imaging of breast: Secondary | ICD-10-CM

## 2023-10-21 NOTE — Progress Notes (Signed)
Surgery orders requested via Epic inbox. °

## 2023-10-23 ENCOUNTER — Encounter (HOSPITAL_COMMUNITY): Payer: Self-pay

## 2023-10-23 NOTE — Progress Notes (Addendum)
PCP - does not have a PCP Cardiologist - no  PPM/ICD -  Device Orders -  Rep Notified -   Chest x-ray -  EKG -  Stress Test -  ECHO -  Cardiac Cath -   Sleep Study -  CPAP -   Fasting Blood Sugar -  Checks Blood Sugar _____ times a day  Blood Thinner Instructions: Aspirin Instructions:  ERAS Protcol - PRE-SURGERY-    COVID vaccine -no  Activity--Able to climb a flight of stairs without CP and SOB Anesthesia review: HGB 9.2  Patient denies shortness of breath, fever, cough and chest pain at PAT appointment   All instructions explained to the patient, with a verbal understanding of the material. Patient agrees to go over the instructions while at home for a better understanding. Patient also instructed to self quarantine after being tested for COVID-19. The opportunity to ask questions was provided.

## 2023-10-23 NOTE — Patient Instructions (Addendum)
SURGICAL WAITING ROOM VISITATION  Patients having surgery or a procedure may have no more than 2 support people in the waiting area - these visitors may rotate.    Children under the age of 74 must have an adult with them who is not the patient.   If the patient needs to stay at the hospital during part of their recovery, the visitor guidelines for inpatient rooms apply. Pre-op nurse will coordinate an appropriate time for 1 support person to accompany patient in pre-op.  This support person may not rotate.    Please refer to the [facility] website for the visitor guidelines for Inpatients (after your surgery is over and you are in a regular room).       Your procedure is scheduled on: 10-30-23   Report to [facility] Main Entrance    Report to admitting at     0815 AM   Call this number if you have problems the morning of surgery [phone]   Do not eat food or drink liquids  :After Midnight.                If you have questions, please contact your surgeon's office.   FOLLOW ANY ADDITIONAL PRE OP INSTRUCTIONS YOU RECEIVED FROM YOUR SURGEON'S OFFICE!!!     Oral Hygiene is also important to reduce your risk of infection.                                    Remember - BRUSH YOUR TEETH THE MORNING OF SURGERY WITH YOUR REGULAR TOOTHPASTE  DENTURES WILL BE REMOVED PRIOR TO SURGERY PLEASE DO NOT APPLY "Poly grip" OR ADHESIVES!!!   Do NOT smoke after Midnight   Stop all vitamins and herbal supplements 7 days before surgery.   Take these medicines the morning of surgery with A SIP OF WATER: Sprintec  DO NOT TAKE ANY ORAL DIABETIC MEDICATIONS DAY OF YOUR SURGERY  Bring CPAP mask and tubing day of surgery.                              You may not have any metal on your body including hair pins, jewelry, and body piercing             Do not wear make-up, lotions, powders, perfumes/cologne, or deodorant  Do not wear nail polish including gel and S&S,  artificial/acrylic nails, or any other type of covering on natural nails including finger and toenails. If you have artificial nails, gel coating, etc. that needs to be removed by a nail salon please have this removed prior to surgery or surgery may need to be canceled/ delayed if the surgeon/ anesthesia feels like they are unable to be safely monitored.   Do not shave  48 hours prior to surgery.                  Do not bring valuables to the hospital. [facility] IS NOT             RESPONSIBLE   FOR VALUABLES.   Contacts, glasses, dentures or bridgework may not be worn into surgery.   Bring small overnight bag day of surgery.   DO NOT BRING YOUR HOME MEDICATIONS TO THE HOSPITAL. PHARMACY WILL DISPENSE MEDICATIONS LISTED ON YOUR MEDICATION LIST TO YOU DURING YOUR ADMISSION IN THE HOSPITAL!    Patients  discharged on the day of surgery will not be allowed to drive home.  Someone NEEDS to stay with you for the first 24 hours after anesthesia.   Special Instructions: Bring a copy of your healthcare power of attorney and living will documents the day of surgery if you haven't scanned them before.              Please read over the following fact sheets you were given: IF YOU HAVE QUESTIONS ABOUT YOUR PRE-OP INSTRUCTIONS PLEASE CALL [phone]   If you test positive for Covid or have been in contact with anyone that has tested positive in the last 10 days please notify you surgeon.    [place] - Preparing for Surgery Before surgery, you can play an important role.  Because skin is not sterile, your skin needs to be as free of germs as possible.  You can reduce the number of germs on your skin by washing with CHG (chlorahexidine gluconate) soap before surgery.  CHG is an antiseptic cleaner which kills germs and bonds with the skin to continue killing germs even after washing. Please DO NOT use if you have an allergy to CHG or antibacterial soaps.  If your skin becomes reddened/irritated stop  using the CHG and inform your nurse when you arrive at Short Stay. Do not shave (including legs and underarms) for at least 48 hours prior to the first CHG shower.  You may shave your face/neck. Please follow these instructions carefully:  1.  Shower with CHG Soap the night before surgery and the  morning of Surgery.  2.  If you choose to wash your hair, wash your hair first as usual with your  normal  shampoo.  3.  After you shampoo, rinse your hair and body thoroughly to remove the  shampoo.                           4.  Use CHG as you would any other liquid soap.  You can apply chg directly  to the skin and wash                       Gently with a scrungie or clean washcloth.  5.  Apply the CHG Soap to your body ONLY FROM THE NECK DOWN.   Do not use on face/ open                           Wound or open sores. Avoid contact with eyes, ears mouth and genitals (private parts).                       Wash face,  Genitals (private parts) with your normal soap.             6.  Wash thoroughly, paying special attention to the area where your surgery  will be performed.  7.  Thoroughly rinse your body with warm water from the neck down.  8.  DO NOT shower/wash with your normal soap after using and rinsing off  the CHG Soap.                9.  Pat yourself dry with a clean towel.            10.  Wear clean pajamas.            11.  Place  clean sheets on your bed the night of your first shower and do not  sleep with pets. Day of Surgery : Do not apply any lotions/deodorants the morning of surgery.  Please wear clean clothes to the hospital/surgery center.  FAILURE TO FOLLOW THESE INSTRUCTIONS MAY RESULT IN THE CANCELLATION OF YOUR SURGERY PATIENT SIGNATURE_________________________________  NURSE SIGNATURE__________________________________  ________________________________________________________________________

## 2023-10-26 ENCOUNTER — Other Ambulatory Visit: Payer: Self-pay | Admitting: Obstetrics and Gynecology

## 2023-10-26 DIAGNOSIS — N939 Abnormal uterine and vaginal bleeding, unspecified: Secondary | ICD-10-CM

## 2023-10-27 ENCOUNTER — Encounter (HOSPITAL_COMMUNITY)
Admission: RE | Admit: 2023-10-27 | Discharge: 2023-10-27 | Disposition: A | Discharge status: Home / Self Care | Payer: Self-pay | Source: Ambulatory Visit | Attending: Obstetrics and Gynecology | Admitting: Obstetrics and Gynecology

## 2023-10-27 ENCOUNTER — Encounter (HOSPITAL_COMMUNITY): Payer: Self-pay

## 2023-10-27 ENCOUNTER — Other Ambulatory Visit: Payer: Self-pay

## 2023-10-27 VITALS — BP 111/66 | HR 73 | Temp 98.9°F | Resp 16 | Ht 66.0 in | Wt 154.0 lb

## 2023-10-27 DIAGNOSIS — D62 Acute posthemorrhagic anemia: Secondary | ICD-10-CM | POA: Insufficient documentation

## 2023-10-27 DIAGNOSIS — Z01812 Encounter for preprocedural laboratory examination: Secondary | ICD-10-CM | POA: Insufficient documentation

## 2023-10-27 DIAGNOSIS — Z01818 Encounter for other preprocedural examination: Secondary | ICD-10-CM

## 2023-10-27 HISTORY — DX: Anemia, unspecified: D64.9

## 2023-10-27 LAB — CBC
HCT: 32.6 % — ABNORMAL LOW (ref 36.0–46.0)
Hemoglobin: 9.2 g/dL — ABNORMAL LOW (ref 12.0–15.0)
MCH: 22.4 pg — ABNORMAL LOW (ref 26.0–34.0)
MCHC: 28.2 g/dL — ABNORMAL LOW (ref 30.0–36.0)
MCV: 79.3 fL — ABNORMAL LOW (ref 80.0–100.0)
Platelets: 282 10*3/uL (ref 150–400)
RBC: 4.11 MIL/uL (ref 3.87–5.11)
RDW: 27.1 % — ABNORMAL HIGH (ref 11.5–15.5)
WBC: 3.6 10*3/uL — ABNORMAL LOW (ref 4.0–10.5)
nRBC: 0 % (ref 0.0–0.2)

## 2023-10-27 LAB — SURGICAL PCR SCREEN
MRSA, PCR: NEGATIVE
Staphylococcus aureus: NEGATIVE

## 2023-10-28 ENCOUNTER — Other Ambulatory Visit: Payer: Self-pay | Admitting: Obstetrics and Gynecology

## 2023-10-29 NOTE — H&P (Signed)
gyn 30 est, 10-22-2023 Performed by Ambrose Mantle, MD, OB/GYN, [phone] Reason for Visit *GYN FOLLOW-UP Assessment & Plan abnormal uterine bleeding  History of Present Illness Pt. here for EMB and pre op visit; on OCPs; consent form signed prior to pt. taking Lorazepam 1mg  and Oxycodone 5mg ; TLH vs TAH/b-salpingectomy scheduled on 11/14; finished Clinda for superficial thrombophlebitis at IV site/tb  Bleeding improved with OCPs. Thrombophlebitis resolved.   Check tissue specimen-endometrium (formalin-filled specimen container only)- [facility] urinary incontinence Selena Batten brought up that she has episodes of "leaking". However they are random throughout the day and are not classic for stress urinary incontinence. I told her that I do not feel comfortable moving forward with placing a sling without a further work up of this issue. She voiced understanding. Will consider urogyn referral after hyst.  No other new complaints.  Review of Systems ROS as noted in the HPI Screening None recorded Physical Exam Annual Exam [facility])  Chaperone Chaperone: present  Constitutional *General Appearance: healthy-appearing, well-nourished, well-developed  Head Head: normocephalic, atraumatic  Neck *ROM normal ROM *Thyroid: no enlargement, no nodules, non-tender  Lymph Nodes *Palpation: non-tender submandibular nodes, non-tender supraclavicular nodes, non-tender axillary nodes  Cardiovascular *Auscultation: RRR, no murmur *Peripheral Vascular: no edema  Lungs *Respiratory Effort: no accessory muscle usage, no intercostal retractions *Auscultation: clear to auscultation, no wheezing, no rales/crackles, no rhonchi Inspection: normal, normal respiratory rate  *Breast Bilateral: no skin changes, nipple appearance: normal, no abnormal nipple secretions, no tenderness, no masses palpable, symmetric  Abdomen *Inspection/Palpation/Auscultation: non-distended, no tenderness, no rebound,  no guarding, mass palpated, uterus palpated >20 weeks *Hernia: none palpated  Female Genitalia Vulva: no masses, no atrophy, no lesions Mons: normal Labia Majora: normal (skin tag on left labia) Labia Minora: normal Introitus: normal Bartholin's Gland: normal *Vagina: normal, no discharge, no blood present Vaginal discharge no abnormal discharge present *Cervix: grossly normal, no lesions, no discharge, no cervical motion tenderness, blood present *Uterus: no uterine prolapse, mobile, non-tender, enlarged 20 weeks size *Urethral Meatus/ Urethra: normal meatus, no masses, no tenderness *Bladder: non-distended, no palpable mass, non-tender *Adnexa/Parametria: no mass palpable, no tenderness  Extremities Legs: normal  Skin *Appearance: no rashes, no lesions  Neurological System Impressions: motor: no deficits, sensory: no deficits  Psychiatric *Orientation: to person, to place, to time *Mood and Affect: active and alert, normal mood, normal affect Previous exam performed 09/23/2023 Procedure Documentation Endometrial Biopsy Procedure Note [facility])   The informed consent was confirmed after risks, benefits, indications and alternatives for the procedure were reviewed with the patient. All questions were answered. A timeout was performed. The patient, procedure to be performed, site of surgery, including laterality (when applicable), and the identity of the individuals in the room were recognized.    Exam reveals: midline   The speculum was placed into the vagina. The cervix was prepped with Betadine. The anterior lip of the cervix was grasped with a tenaculum. An endometrial sampling device was introduced. The uterus sounded to 8 cm. Suction was initiated. A total of 1 passes were performed. Inadequate endometrial tissue was obtained. Endocervical curettage was not performed. The tenaculum site were rendered hemostatic after application of pressure. The specimen was sent to  pathology.   The patient tolerated the procedure with moderate pain. She was monitored per protocol and discharged home after meeting criteria.    Assessment/Surgery planning: Olivia Alvarez is a 42 year old [phone] (2xSVD, 2xAB) with uncomplicated PMH and PSH of tubal sterilization who originally presented with heavy menstrual bleeding  and pelvic discomfort. A work-up was planned with me, however, soon after, she ended up needing to go to the hospital for heavy vaginal bleeding and severe acute blood loss anemia with a hemoglobin of 4. She received 4units of packed red blood cells. A CT was performed which showed a 13cm posterior fibroid that I suspect is the cause of her pelvic discomfort and heavy menstrual bleeding. After initial discussion of R/B/A, Kim determined that she wants definitive management.   EMB collected and pending. Discussed higher chance of needing to open given the size of her fibroid. Recommended continuing OCP until the day before surgery in order to continue to control vaginal bleeding. Plan pre-op CBC and type and screen. Plan one pre-op dose of heparin and SCDs for DVT prophylaxis. Plan 2g Ancef (and 500mg  of Flagyl for infection prophylaxis. Post-op pain control per ERAS, likely same day discharge.  Today we discussed the risks, benefits, and alternatives of RA TLH-BS, possible TAH-BS, left vulvar skin lesion excision, and other indicated procedures. Specific procedure risks discussed included risk of bleeding, infection (wound infection or pelvic infection) and damage to other structures. Specifically discussed risk of damage to blood vessels, nerves, bowel, bladder, and ureters. She understands that any of the above could necessitate further laparoscopy or laparotomy intraop, as well as additional treatment or surgeries (either on the day of surgery or possibly in the future). In terms of risk of bleeding, pt accepts a blood transfusion if necessary and understands the risks  involved in a blood transfusion. She is also aware that the surgery could improve, worsen, or not change her pelvic discomfort. After discussion of the above, pt wishes to proceed with the scheduled procedure.  Addendum: EMB benign

## 2023-10-30 ENCOUNTER — Ambulatory Visit (HOSPITAL_COMMUNITY)
Admission: RE | Admit: 2023-10-30 | Discharge: 2023-10-30 | Disposition: A | Discharge status: Home / Self Care | Payer: Self-pay | Attending: Obstetrics and Gynecology | Admitting: Obstetrics and Gynecology

## 2023-10-30 ENCOUNTER — Ambulatory Visit (HOSPITAL_COMMUNITY): Payer: Self-pay | Admitting: Physician Assistant

## 2023-10-30 ENCOUNTER — Encounter (HOSPITAL_COMMUNITY): Payer: Self-pay | Admitting: Obstetrics and Gynecology

## 2023-10-30 ENCOUNTER — Encounter (HOSPITAL_COMMUNITY)
Admission: RE | Disposition: A | Discharge status: Home / Self Care | Payer: Self-pay | Source: Home / Self Care | Attending: Obstetrics and Gynecology

## 2023-10-30 ENCOUNTER — Other Ambulatory Visit: Payer: Self-pay

## 2023-10-30 ENCOUNTER — Ambulatory Visit (HOSPITAL_BASED_OUTPATIENT_CLINIC_OR_DEPARTMENT_OTHER): Payer: Self-pay | Admitting: Anesthesiology

## 2023-10-30 DIAGNOSIS — Z01818 Encounter for other preprocedural examination: Secondary | ICD-10-CM

## 2023-10-30 DIAGNOSIS — Z87891 Personal history of nicotine dependence: Secondary | ICD-10-CM | POA: Insufficient documentation

## 2023-10-30 DIAGNOSIS — D251 Intramural leiomyoma of uterus: Secondary | ICD-10-CM | POA: Insufficient documentation

## 2023-10-30 DIAGNOSIS — N939 Abnormal uterine and vaginal bleeding, unspecified: Secondary | ICD-10-CM

## 2023-10-30 DIAGNOSIS — Z8614 Personal history of Methicillin resistant Staphylococcus aureus infection: Secondary | ICD-10-CM | POA: Insufficient documentation

## 2023-10-30 DIAGNOSIS — D280 Benign neoplasm of vulva: Secondary | ICD-10-CM | POA: Insufficient documentation

## 2023-10-30 HISTORY — PX: CYSTOSCOPY: SHX5120

## 2023-10-30 HISTORY — PX: ROBOTIC ASSISTED TOTAL HYSTERECTOMY WITH BILATERAL SALPINGO OOPHERECTOMY: SHX6086

## 2023-10-30 LAB — TYPE AND SCREEN
ABO/RH(D): O NEG
Antibody Screen: NEGATIVE

## 2023-10-30 LAB — POCT PREGNANCY, URINE: Preg Test, Ur: NEGATIVE

## 2023-10-30 SURGERY — HYSTERECTOMY, TOTAL, ROBOT-ASSISTED, LAPAROSCOPIC, WITH BILATERAL SALPINGO-OOPHORECTOMY
Anesthesia: General

## 2023-10-30 MED ORDER — METRONIDAZOLE 500 MG/100ML IV SOLN
INTRAVENOUS | Status: DC | PRN
  Administered 2023-10-30: 500 mg via INTRAVENOUS

## 2023-10-30 MED ORDER — PHENYLEPHRINE 80 MCG/ML (10ML) SYRINGE FOR IV PUSH (FOR BLOOD PRESSURE SUPPORT)
PREFILLED_SYRINGE | INTRAVENOUS | Status: DC | PRN
  Administered 2023-10-30 (×2): 80 ug via INTRAVENOUS

## 2023-10-30 MED ORDER — DROPERIDOL 2.5 MG/ML IJ SOLN: 0.6250 mg | Freq: Once | INTRAMUSCULAR | Status: DC | PRN

## 2023-10-30 MED ORDER — SPY AGENT GREEN - (INDOCYANINE FOR INJECTION)
INTRAMUSCULAR | Status: DC | PRN
  Administered 2023-10-30: 2.5 mL via INTRAVENOUS

## 2023-10-30 MED ORDER — HEPARIN SODIUM (PORCINE) 5000 UNIT/ML IJ SOLN
5000.0000 [IU] | INTRAMUSCULAR | Status: AC
Start: 2023-10-30 — End: 2023-10-30
  Administered 2023-10-30: 5000 [IU] via SUBCUTANEOUS
  Filled 2023-10-30: qty 1

## 2023-10-30 MED ORDER — SODIUM CHLORIDE (PF) 0.9 % IJ SOLN
INTRAMUSCULAR | Status: AC
  Filled 2023-10-30: qty 20

## 2023-10-30 MED ORDER — PROPOFOL 10 MG/ML IV BOLUS
INTRAVENOUS | Status: AC
  Filled 2023-10-30: qty 20

## 2023-10-30 MED ORDER — SODIUM CHLORIDE 0.9 % IV SOLN: INTRAVENOUS | Status: DC | PRN

## 2023-10-30 MED ORDER — ROPIVACAINE HCL 5 MG/ML IJ SOLN
INTRAMUSCULAR | Status: AC
  Filled 2023-10-30: qty 30

## 2023-10-30 MED ORDER — OXYCODONE HCL 5 MG PO TABS: 5.0000 mg | ORAL_TABLET | ORAL | 0 refills | Status: AC | PRN

## 2023-10-30 MED ORDER — ALBUMIN HUMAN 5 % IV SOLN: INTRAVENOUS | Status: DC | PRN

## 2023-10-30 MED ORDER — FENTANYL CITRATE (PF) 100 MCG/2ML IJ SOLN
INTRAMUSCULAR | Status: AC
  Filled 2023-10-30: qty 2

## 2023-10-30 MED ORDER — FENTANYL CITRATE (PF) 100 MCG/2ML IJ SOLN
INTRAMUSCULAR | Status: DC | PRN
  Administered 2023-10-30: 100 ug via INTRAVENOUS

## 2023-10-30 MED ORDER — ROCURONIUM BROMIDE 10 MG/ML (PF) SYRINGE
PREFILLED_SYRINGE | INTRAVENOUS | Status: AC
  Filled 2023-10-30: qty 20

## 2023-10-30 MED ORDER — SODIUM CHLORIDE (PF) 0.9 % IJ SOLN
INTRAMUSCULAR | Status: AC
  Filled 2023-10-30: qty 50

## 2023-10-30 MED ORDER — METRONIDAZOLE 500 MG/100ML IV SOLN
INTRAVENOUS | Status: AC
  Filled 2023-10-30: qty 100

## 2023-10-30 MED ORDER — CEFAZOLIN SODIUM-DEXTROSE 2-4 GM/100ML-% IV SOLN
2.0000 g | INTRAVENOUS | Status: AC
Start: 2023-10-30 — End: 2023-10-30
  Administered 2023-10-30 (×2): 2 g via INTRAVENOUS
  Filled 2023-10-30: qty 100

## 2023-10-30 MED ORDER — OXYCODONE HCL 5 MG PO TABS
5.0000 mg | ORAL_TABLET | ORAL | 0 refills | Status: DC | PRN
  Filled 2023-10-30: qty 10, 2d supply, fill #0

## 2023-10-30 MED ORDER — POVIDONE-IODINE 10 % EX SWAB: 2.0000 | Freq: Once | CUTANEOUS | Status: DC

## 2023-10-30 MED ORDER — ORAL CARE MOUTH RINSE: 15.0000 mL | Freq: Once | OROMUCOSAL | Status: AC

## 2023-10-30 MED ORDER — OXYCODONE HCL 5 MG/5ML PO SOLN
5.0000 mg | Freq: Once | ORAL | Status: AC | PRN
Start: 2023-10-30 — End: 2023-10-30

## 2023-10-30 MED ORDER — FENTANYL CITRATE PF 50 MCG/ML IJ SOSY
25.0000 ug | PREFILLED_SYRINGE | INTRAMUSCULAR | Status: DC | PRN
  Administered 2023-10-30: 50 ug via INTRAVENOUS

## 2023-10-30 MED ORDER — ACETAMINOPHEN 10 MG/ML IV SOLN: 1000.0000 mg | Freq: Once | INTRAVENOUS | Status: DC | PRN

## 2023-10-30 MED ORDER — LIDOCAINE 2% (20 MG/ML) 5 ML SYRINGE
INTRAMUSCULAR | Status: DC | PRN
  Administered 2023-10-30: 1.5 mg/kg/h via INTRAVENOUS

## 2023-10-30 MED ORDER — SCOPOLAMINE 1 MG/3DAYS TD PT72SCOPOLAMINE 1 MG/3DAYS
1.0000 | MEDICATED_PATCH | TRANSDERMAL | Status: DC
Start: 2023-10-30 — End: 2023-10-31
  Administered 2023-10-30: 1.5 mg via TRANSDERMAL
  Filled 2023-10-30: qty 1

## 2023-10-30 MED ORDER — MIDAZOLAM HCL 2 MG/2ML IJ SOLN
INTRAMUSCULAR | Status: AC
  Filled 2023-10-30: qty 2

## 2023-10-30 MED ORDER — FENTANYL CITRATE PF 50 MCG/ML IJ SOSY
PREFILLED_SYRINGE | INTRAMUSCULAR | Status: AC
  Filled 2023-10-30: qty 1

## 2023-10-30 MED ORDER — STERILE WATER FOR IRRIGATION IR SOLN
Status: DC | PRN
  Administered 2023-10-30: 1000 mL

## 2023-10-30 MED ORDER — PROPOFOL 10 MG/ML IV BOLUS
INTRAVENOUS | Status: DC | PRN
  Administered 2023-10-30: 150 mg via INTRAVENOUS

## 2023-10-30 MED ORDER — LACTATED RINGERS IV SOLN: INTRAVENOUS | Status: DC

## 2023-10-30 MED ORDER — ONDANSETRON HCL 4 MG/2ML IJ SOLN
INTRAMUSCULAR | Status: DC | PRN
  Administered 2023-10-30 (×2): 4 mg via INTRAVENOUS

## 2023-10-30 MED ORDER — PHENYLEPHRINE 80 MCG/ML (10ML) SYRINGE FOR IV PUSH (FOR BLOOD PRESSURE SUPPORT)
PREFILLED_SYRINGE | INTRAVENOUS | Status: AC
  Filled 2023-10-30: qty 10

## 2023-10-30 MED ORDER — CHLORHEXIDINE GLUCONATE 0.12 % MT SOLN
15.0000 mL | Freq: Once | OROMUCOSAL | Status: AC
  Administered 2023-10-30: 15 mL via OROMUCOSAL

## 2023-10-30 MED ORDER — TRANEXAMIC ACID-NACL 1000-0.7 MG/100ML-% IV SOLN
1000.0000 mg | Freq: Once | INTRAVENOUS | Status: AC
  Administered 2023-10-30: 1000 mg via INTRAVENOUS
  Filled 2023-10-30: qty 100

## 2023-10-30 MED ORDER — SODIUM CHLORIDE 0.9 % IV SOLN
INTRAVENOUS | Status: DC | PRN
  Administered 2023-10-30: 60 mL

## 2023-10-30 MED ORDER — OXYCODONE HCL 5 MG PO TABS
ORAL_TABLET | ORAL | Status: AC
  Filled 2023-10-30: qty 1

## 2023-10-30 MED ORDER — ACETAMINOPHEN 500 MG PO TABS
1000.0000 mg | ORAL_TABLET | ORAL | Status: AC
  Administered 2023-10-30: 1000 mg via ORAL
  Filled 2023-10-30: qty 2

## 2023-10-30 MED ORDER — PHENYLEPHRINE HCL-NACL 20-0.9 MG/250ML-% IV SOLN
INTRAVENOUS | Status: DC | PRN
  Administered 2023-10-30: 25 ug/min via INTRAVENOUS

## 2023-10-30 MED ORDER — CEFAZOLIN SODIUM 1 G IJ SOLR
INTRAMUSCULAR | Status: AC
  Filled 2023-10-30: qty 20

## 2023-10-30 MED ORDER — SODIUM CHLORIDE 0.9 % IR SOLN
Status: DC | PRN
  Administered 2023-10-30: 1000 mL via INTRAVESICAL
  Administered 2023-10-30: 1000 mL

## 2023-10-30 MED ORDER — GABAPENTIN 300 MG PO CAPS
300.0000 mg | ORAL_CAPSULE | ORAL | Status: AC
  Administered 2023-10-30: 300 mg via ORAL
  Filled 2023-10-30: qty 1

## 2023-10-30 MED ORDER — SUGAMMADEX SODIUM 200 MG/2ML IV SOLN
INTRAVENOUS | Status: DC | PRN
  Administered 2023-10-30: 200 mg via INTRAVENOUS

## 2023-10-30 MED ORDER — MIDAZOLAM HCL 5 MG/5ML IJ SOLN
INTRAMUSCULAR | Status: DC | PRN
  Administered 2023-10-30: 2 mg via INTRAVENOUS

## 2023-10-30 MED ORDER — OXYCODONE HCL 5 MG PO TABS
5.0000 mg | ORAL_TABLET | Freq: Once | ORAL | Status: AC | PRN
  Administered 2023-10-30: 5 mg via ORAL

## 2023-10-30 MED ORDER — ROCURONIUM BROMIDE 100 MG/10ML IV SOLN
INTRAVENOUS | Status: DC | PRN
  Administered 2023-10-30 (×2): 20 mg via INTRAVENOUS
  Administered 2023-10-30: 70 mg via INTRAVENOUS
  Administered 2023-10-30: 30 mg via INTRAVENOUS
  Administered 2023-10-30 (×2): 20 mg via INTRAVENOUS

## 2023-10-30 MED ORDER — SOD CITRATE-CITRIC ACID 500-334 MG/5ML PO SOLN: 30.0000 mL | ORAL | Status: DC

## 2023-10-30 MED ORDER — DEXAMETHASONE SODIUM PHOSPHATE 10 MG/ML IJ SOLN
INTRAMUSCULAR | Status: DC | PRN
  Administered 2023-10-30: 10 mg via INTRAVENOUS

## 2023-10-30 MED ORDER — LIDOCAINE HCL (CARDIAC) PF 100 MG/5ML IV SOSY
PREFILLED_SYRINGE | INTRAVENOUS | Status: DC | PRN
  Administered 2023-10-30: 50 mg via INTRAVENOUS

## 2023-10-30 SURGICAL SUPPLY — 56 items
APPLICATOR ARISTA FLEXITIP XL (MISCELLANEOUS) IMPLANT
BARRIER ADHS 3X4 INTERCEED (GAUZE/BANDAGES/DRESSINGS) IMPLANT
BLADE SURG SZ10 CARB STEEL (BLADE) IMPLANT
CANISTER SUCT 3000ML PPV (MISCELLANEOUS) ×1 IMPLANT
CATH FOLEY 2WAY SLVR 5CC 16FR (CATHETERS) ×1 IMPLANT
CHLORAPREP W/TINT 26 (MISCELLANEOUS) ×1 IMPLANT
COVER BACK TABLE 60X90IN (DRAPES) ×1 IMPLANT
COVER TIP SHEARS 8 DVNC (MISCELLANEOUS) ×1 IMPLANT
DEFOGGER SCOPE WARMER CLEARIFY (MISCELLANEOUS) ×1 IMPLANT
DERMABOND ADVANCED .7 DNX12 (GAUZE/BANDAGES/DRESSINGS) ×1 IMPLANT
DRAPE ARM DVNC X/XI (DISPOSABLE) ×4 IMPLANT
DRAPE COLUMN DVNC XI (DISPOSABLE) ×1 IMPLANT
DRIVER NDL MEGA SUTCUT DVNCXI (INSTRUMENTS) ×2 IMPLANT
DRIVER NDLE MEGA SUTCUT DVNCXI (INSTRUMENTS) ×2
ELECT REM PT RETURN 15FT ADLT (MISCELLANEOUS) ×1 IMPLANT
FORCEPS BPLR FENES DVNC XI (FORCEP) ×1 IMPLANT
FORCEPS PROGRASP DVNC XI (FORCEP) ×1 IMPLANT
FORCEPS TENACULUM DVNC XI (FORCEP) ×1 IMPLANT
GLOVE BIO SURGEON STRL SZ7 (GLOVE) ×3 IMPLANT
GLOVE BIOGEL PI IND STRL 7.0 (GLOVE) ×2 IMPLANT
GOWN STRL REUS W/ TWL XL LVL3 (GOWN DISPOSABLE) ×5 IMPLANT
GOWN STRL REUS W/TWL XL LVL3 (GOWN DISPOSABLE) ×5
HEMOSTAT ARISTA ABSORB 3G PWDR (HEMOSTASIS) IMPLANT
IRRIG SUCT STRYKERFLOW 2 WTIP (MISCELLANEOUS) ×1
IRRIGATION SUCT STRKRFLW 2 WTP (MISCELLANEOUS) ×1 IMPLANT
KIT TURNOVER KIT A (KITS) IMPLANT
LEGGING LITHOTOMY PAIR STRL (DRAPES) ×1 IMPLANT
MANIPULATOR ADVINCU DEL 2.5 PL (MISCELLANEOUS) IMPLANT
MANIPULATOR ADVINCU DEL 3.0 PL (MISCELLANEOUS) IMPLANT
MANIPULATOR ADVINCU DEL 3.5 PL (MISCELLANEOUS) IMPLANT
MANIPULATOR ADVINCU DEL 4.0 PL (MISCELLANEOUS) IMPLANT
NDL INSUFFLATION 14GA 120MM (NEEDLE) ×1 IMPLANT
NEEDLE INSUFFLATION 14GA 120MM (NEEDLE) ×1
OBTURATOR OPTICAL STND 8 DVNC (TROCAR) ×1
OBTURATOR OPTICALSTD 8 DVNC (TROCAR) ×1 IMPLANT
PACK ROBOT WH (CUSTOM PROCEDURE TRAY) ×1 IMPLANT
PAD POSITIONING PINK XL (MISCELLANEOUS) IMPLANT
PAD PREP 24X48 CUFFED NSTRL (MISCELLANEOUS) ×1 IMPLANT
PENCIL SMOKE EVACUATOR (MISCELLANEOUS) IMPLANT
PROTECTOR NERVE ULNAR (MISCELLANEOUS) ×2 IMPLANT
RTRCTR WOUND ALEXIS 18CM SML (INSTRUMENTS) ×1
SAVER CELL AAL HAEMONETICS (INSTRUMENTS) IMPLANT
SCISSORS MNPLR CVD DVNC XI (INSTRUMENTS) ×2 IMPLANT
SEAL UNIV 5-12 XI (MISCELLANEOUS) ×3 IMPLANT
SET IRRIG Y TYPE TUR BLADDER L (SET/KITS/TRAYS/PACK) ×1 IMPLANT
SET TRI-LUMEN FLTR TB AIRSEAL (TUBING) ×1 IMPLANT
SPIKE FLUID TRANSFER (MISCELLANEOUS) ×2 IMPLANT
SUT DVC VLOC 180 0 12IN GS21 (SUTURE)
SUT VIC AB 2-0 CT2 27 (SUTURE) ×2 IMPLANT
SUT VIC AB 2-0 UR6 27 (SUTURE) IMPLANT
SUT VICRYL RAPIDE 3 0 (SUTURE) ×2 IMPLANT
SUT VLOC 180 0 9IN GS21 (SUTURE) ×1 IMPLANT
SUTURE DVC VLC 180 0 12IN GS21 (SUTURE) IMPLANT
TOWEL OR 17X26 10 PK STRL BLUE (TOWEL DISPOSABLE) ×1 IMPLANT
TROCAR PORT AIRSEAL 5X120 (TROCAR) ×1 IMPLANT
WATER STERILE IRR 1000ML POUR (IV SOLUTION) ×1 IMPLANT

## 2023-10-30 NOTE — Brief Op Note (Signed)
10/30/2023  6:26 PM  PATIENT:  Olivia Alvarez  42 y.o. female  PRE-OPERATIVE DIAGNOSIS:  ABNORMAL UTERINE BLEEDING  POST-OPERATIVE DIAGNOSIS:  ABNORMAL UTERINE BLEEDING  PROCEDURE:  Procedure(s) with comments: XI ROBOTIC ASSISTED TOTAL HYSTERECTOMY WITH BILATERAL SALPINGECTOMY (N/A) - per dr Clint Lipps: "please consider TAP block" CYSTOSCOPY, EXCISION LEFT VULVAR LESION (N/A)  SURGEON:  Surgeons and Role:    * Willa Frater, MD - Primary    * Carrington Clamp, MD - Assisting  PHYSICIAN ASSISTANT:   ANESTHESIA:   local and general  EBL:  400 mL   BLOOD ADMINISTERED:none  DRAINS: none   LOCAL MEDICATIONS USED:  OTHER Ropivicane, Arista  SPECIMEN:  Source of Specimen:  Uterus, cervix, bilateral fallopian tubes; left labial skin lesoin  DISPOSITION OF SPECIMEN:  PATHOLOGY  COUNTS:  YES  TOURNIQUET:  * No tourniquets in log *  DICTATION: .Note written in EPIC  PLAN OF CARE: Admit for overnight observation  PATIENT DISPOSITION:  PACU - hemodynamically stable.   Delay start of Pharmacological VTE agent (>24hrs) due to surgical blood loss or risk of bleeding: not applicable

## 2023-10-30 NOTE — Anesthesia Procedure Notes (Signed)
Procedure Name: Intubation Date/Time: 10/30/2023 11:30 AM  Performed by: Carloyn Manner, CRNAPre-anesthesia Checklist: Patient identified, Emergency Drugs available, Suction available, Patient being monitored and Timeout performed Patient Re-evaluated:Patient Re-evaluated prior to induction Oxygen Delivery Method: Circle system utilized Preoxygenation: Pre-oxygenation with 100% oxygen Induction Type: IV induction Ventilation: Mask ventilation without difficulty Laryngoscope Size: Miller and 2 Grade View: Grade I Tube type: Oral Tube size: 7.0 mm Number of attempts: 1 Airway Equipment and Method: Stylet Placement Confirmation: ETT inserted through vocal cords under direct vision, positive ETCO2 and breath sounds checked- equal and bilateral Secured at: 22 cm Tube secured with: Tape Dental Injury: Teeth and Oropharynx as per pre-operative assessment

## 2023-10-30 NOTE — Anesthesia Preprocedure Evaluation (Signed)
Anesthesia Evaluation  Patient identified by MRN, date of birth, ID band Patient awake    Reviewed: Allergy & Precautions, H&P , NPO status , Patient's Chart, lab work & pertinent test results  Airway Mallampati: II  TM Distance: >3 FB Neck ROM: Full    Dental no notable dental hx.    Pulmonary neg pulmonary ROS, Not current smoker   Pulmonary exam normal breath sounds clear to auscultation       Cardiovascular (-) hypertension(-) angina negative cardio ROS Normal cardiovascular exam Rhythm:Regular Rate:Normal     Neuro/Psych neg Seizures PSYCHIATRIC DISORDERS Anxiety Depression    negative neurological ROS     GI/Hepatic negative GI ROS, Neg liver ROS,neg GERD  ,,  Endo/Other  negative endocrine ROS    Renal/GU negative Renal ROS  negative genitourinary   Musculoskeletal negative musculoskeletal ROS (+)    Abdominal   Peds negative pediatric ROS (+)  Hematology  (+) Blood dyscrasia, anemia   Anesthesia Other Findings   Reproductive/Obstetrics negative OB ROS                             Anesthesia Physical Anesthesia Plan  ASA: 2  Anesthesia Plan: General   Post-op Pain Management:    Induction: Intravenous  PONV Risk Score and Plan: 3 and Ondansetron, Dexamethasone and Scopolamine patch - Pre-op  Airway Management Planned: Oral ETT  Additional Equipment:   Intra-op Plan:   Post-operative Plan: Extubation in OR  Informed Consent: I have reviewed the patients History and Physical, chart, labs and discussed the procedure including the risks, benefits and alternatives for the proposed anesthesia with the patient or authorized representative who has indicated his/her understanding and acceptance.     Dental advisory given  Plan Discussed with: CRNA  Anesthesia Plan Comments:        Anesthesia Quick Evaluation

## 2023-10-30 NOTE — Op Note (Addendum)
10/30/2023  6:26 PM  PATIENT:  Olivia Alvarez  42 y.o. female  PRE-OPERATIVE DIAGNOSIS:  ABNORMAL UTERINE BLEEDING  POST-OPERATIVE DIAGNOSIS:  ABNORMAL UTERINE BLEEDING  PROCEDURE:  Procedure(s) with comments: XI ROBOTIC ASSISTED TOTAL HYSTERECTOMY WITH BILATERAL SALPINGECTOMY (N/A) - per dr Clint Lipps: "please consider TAP block" CYSTOSCOPY, EXCISION LEFT VULVAR LESION (N/A)  SURGEON:  Surgeons and Role:    * Willa Frater, MD - Primary    * Carrington Clamp, MD - Assisting  ANESTHESIA:   local and general  EBL:  400 mL   BLOOD ADMINISTERED:none  DRAINS: none   LOCAL MEDICATIONS USED:  OTHER Ropivicane, Arista  SPECIMEN:  Source of Specimen:  Uterus, cervix, bilateral fallopian tubes; left labial skin lesoin  DISPOSITION OF SPECIMEN:  PATHOLOGY  COUNTS:  YES  TOURNIQUET:  * No tourniquets in log *  DICTATION: .Note written in EPIC  PLAN OF CARE: Admit for overnight observation  PATIENT DISPOSITION:  PACU - hemodynamically stable.   Delay start of Pharmacological VTE agent (>24hrs) due to surgical blood loss or risk of bleeding: not applicable  Complications:  None.  Findings:  21 weeks size uterus.  Ovaries were normal.  The ureters were identified during multiple points of the case and were always out of the field of dissection.  On cystoscopy, the bladder was intact and bilateral spill was seen from each ureteral oriface.    Medications:  Ancef, Flagyl.  Ropivicaine.      Technique:   After adequate anesthesia was achieved the patient was positioned, prepped and draped in usual sterile fashion.  A speculum was placed in the vagina..  The 3.5 cm Koh ring Advincula was assembled and placed in proper fashion.  The speculum was removed and the bladder catheterized with a foley.     Attention was turned to the abdomen where a 1 cm incision was made at Palmer's point, 3 cm below the costal border at the midclavicular line.  The veress needle was  introduced without aspiration of bowel contents or blood and the abdomen insufflated. The 8.5 mm Robotic trocar was placed under direct visualization approximately 3cm above the umbilicus and the other two trocar sites were marked out, all approximately 10 cm from each other and the camera.  Two 8.5 mm trocars were placed on either side of the camera port under direct visualization and a 5 mm assistant port was placed at the site of the Palmer's point incision. The patient was placed in trendelenburg and then the Robot docked.  The fenestrated bipolar were placed on arm 1 and the monopolar scissors on arm 3 and introduced under direct visualization of the camera.   I then broke scrub and sat down at the console.  The above findings were noted and the ureters identified well out of the field of dissection.  The right fallopian tube was isolated and cauterized with the bipolar, and removed from the body.  The Utero-ovarian ligament was then divided with the bipolar cautery and scissors.  The posterior broad ligament was then divided with the monopolar scissors until the uterosacral ligament.  The Broad and cardinal ligaments were then cauterized against the cervix to the level of the Koh ring, securing the uterine artery.  Each pedicle was then incised with the monopolar scissors.  The anterior leaf was then incised at the reflection of the vessico-uterine junction and the lateral bladder retracted inferiorly after the round ligament had been divided with the bipolar forceps.  The left tube was cauterized  with the bipolar and divided with the monopolar scissors and removed from the body. Then the left utero-ovarian ligament divided with the bipolar forceps and the scissors.  The round ligament was divided as well and the posterior leaf of the broad ligament then divided with the monopolar scissors. The broad and cardinal ligaments were then cauterized on the left in the same way.   At the level of the internal os,  the uterine arteries were bilaterally cauterized with the bipolar.  The ureters were identified well out of the field of dissection.     The bladder was then able to be retracted inferiorly and the vesico-uterine fascia was incised in the midline until the bladder was removed one cm below the Koh ring.  The monopolar scissors then circumferentially incised the vagina at the level of the reflection on the [facility] ring.  Once the uterus and cervix were amputated, cautery was used to insure hemostasis of the cuff. The robotic instruments were removed and robot was undocked to allow for appropriate patient positioning. I then re-scrubbed and went back to the pelvis. A small Alexis retractor was placed inside the vagina.  Using lehey thyroid clamps, the uterus was brought to the vaginal introitus and removed in pieces via coring with a scalpel as was necessary due the size of the uterus and fibroids. This process took approximately 1.5 hours.   Once the uterus and cervix were fully removed from the pelvis, I broke scrub and returned to the robotic console. The robot was redocked. The scissors were changed to the mega suture cut needle driver and the cuff was closed with a running stitches of 0-vicryl V loc.  Cautery was used to ensure hemostasis of the pedicles very superficially. The intra-abdominal pressure was turned down to with good hemostasis noted. Arista was applied to the vaginal cuff.The ureters were peristalsing bilaterally well and very lateral to the areas of operation. The needle was removed and Ropivicaine was introduced into the pelvis.   The Robot was then undocked and I scrubbed back in. A digital exam confirmed intact vaginal cuff. A 1cm skin lesion of the left labia majora was removed with Metzenbaum scissors and the small incision was closed with interrupted 4-0 monocryl x2.   The skin incisions were closed with subcuticular stitches of 3-0 vicryl Rapide and Dermabond.  All instruments were  removed from the vagina and cystoscopy performed, revealing an intact bladder and vigourous spill of urine from each ureteral orifice.  The cystoscope was removed and the patient taken to the recovery room in stable condition.  Post-op weight of uterus, cervix, and fallopian tubes: 1463g UOP during surgery: 500cc During surgery, patient received 1800cc LR and 2 doses of albumin.  Ancef was redosed at 4 hours.  Ambrose Mantle, MD

## 2023-10-30 NOTE — Transfer of Care (Signed)
Immediate Anesthesia Transfer of Care Note  Patient: Olivia Alvarez  Procedure(s) Performed: XI ROBOTIC ASSISTED TOTAL HYSTERECTOMY WITH BILATERAL SALPINGECTOMY CYSTOSCOPY, EXCISION LEFT VULVAR LESION  Patient Location: PACU  Anesthesia Type:General  Level of Consciousness: awake  Airway & Oxygen Therapy: Patient Spontanous Breathing and Patient connected to nasal cannula oxygen  Post-op Assessment: Report given to RN and Post -op Vital signs reviewed and stable  Post vital signs: Reviewed and stable  Last Vitals:  Vitals Value Taken Time  BP 114/71 10/30/23 1826  Temp    Pulse 101 10/30/23 1828  Resp 12 10/30/23 1828  SpO2 100 % 10/30/23 1828  Vitals shown include unfiled device data.  Last Pain:  Vitals:   10/30/23 0855  TempSrc:   PainSc: 0-No pain         Complications: No notable events documented.

## 2023-10-30 NOTE — Discharge Summary (Signed)
Physician Discharge Summary  Patient ID: Olivia Alvarez MRN: [id number] DOB/AGE: 1981-08-02 42 y.o.  Admit date: 10/30/2023 Discharge date: 10/30/2023  Admission Diagnoses: abnormal uterine bleeding, pelvic mass, uterine fibroids  Discharge Diagnoses:  Post-operative state   Discharged Condition: good  Hospital Course: Olivia Alvarez is a 42 year old female with AUB, pelvic mass due to uterine fibroids, and left labial skin lesion admitted for a robotic assisted total laparoscopic hysterectomy, bilateral salpingectomy, cystoscopy, and left labial lesion excision. The surgery was uncomplicated and she tolerated it well. EBL 400cc. She went to the PACU to recover after surgery.  After meeting post-op goals, she was discharged on POD0 with plans to follow up for a routine 2 week postop visit.  Consults: None  Significant Diagnostic Studies: labs: CBC  Treatments: IV hydration, antibiotics: Ancef and metronidazole, analgesia: acetaminophen and oxycodone, gabapentin, toradol anticoagulation: heparin, therapies: RN, and surgery: robotic assisted total laparoscopic hysterectomy, bilateral salpingectomy, cystoscopy, and left labial lesion excision  Discharge Exam: Blood pressure 120/82, pulse 86, temperature 98.5 F (36.9 C), temperature source Oral, resp. rate 15, height 5\' 6"  (1.676 m), weight 70.3 kg, last menstrual period 10/03/2023, SpO2 97%. General appearance: no distress and sleeping Head: Normocephalic, without obvious abnormality, atraumatic Resp: normal work of breathing Cardio: well perfused GI: moderate distension c/w post-operative state Incision/Wound: clean, dry, intact  Disposition: Discharge disposition: 01-Home or Self Care       Discharge Instructions     Call MD for:  persistant nausea and vomiting   Complete by: As directed    Call MD for:  severe uncontrolled pain   Complete by: As directed    Call MD for:  temperature >100.4   Complete by: As  directed    Diet - low sodium heart healthy   Complete by: As directed    Discharge instructions   Complete by: As directed    No driving on narcotics, no sexual activity for 2 weeks.   Increase activity slowly   Complete by: As directed    May shower / Bathe   Complete by: As directed    Shower, no bath for 2 weeks.   Remove dressing in 24 hours   Complete by: As directed    Sexual Activity Restrictions   Complete by: As directed    No sexual activity for 2 weeks.      Allergies as of 10/30/2023       Reactions   Sulfa Antibiotics Other (See Comments)   UNKNOWN        Medication List     STOP taking these medications    Sprintec 28 0.25-35 MG-MCG tablet Generic drug: norgestimate-ethinyl estradiol       TAKE these medications    acetaminophen 500 MG tablet Commonly known as: TYLENOL Take 2 tablets (1,000 mg total) by mouth every 6 (six) hours as needed.   ferrous sulfate 325 (65 FE) MG tablet Take 1 tablet (325 mg total) by mouth 2 (two) times daily before a meal. What changed: when to take this   oxyCODONE 5 MG immediate release tablet Commonly known as: Roxicodone Take 1 tablet (5 mg total) by mouth every 4 (four) hours as needed for up to 3 days for severe pain (pain score 7-10).   VITAMIN C PO Take 1 tablet by mouth daily.        Follow-up Information     [facility] Follow up in 2 week(s).  Signed: Willa Frater 10/30/2023, 6:26 PM

## 2023-10-30 NOTE — Anesthesia Postprocedure Evaluation (Signed)
Anesthesia Post Note  Patient: Olivia Alvarez  Procedure(s) Performed: XI ROBOTIC ASSISTED TOTAL HYSTERECTOMY WITH BILATERAL SALPINGECTOMY CYSTOSCOPY, EXCISION LEFT VULVAR LESION     Patient location during evaluation: PACU Anesthesia Type: General Level of consciousness: awake and alert Pain management: pain level controlled Vital Signs Assessment: post-procedure vital signs reviewed and stable Respiratory status: spontaneous breathing, nonlabored ventilation, respiratory function stable and patient connected to nasal cannula oxygen Cardiovascular status: blood pressure returned to baseline and stable Postop Assessment: no apparent nausea or vomiting Anesthetic complications: no   No notable events documented.  Last Vitals:  Vitals:   10/30/23 1908 10/30/23 1915  BP:  117/77  Pulse: 84 89  Resp: 12 13  Temp:    SpO2: 100% 98%    Last Pain:  Vitals:   10/30/23 1915  TempSrc:   PainSc: Asleep                 Trevor Iha

## 2023-10-30 NOTE — Progress Notes (Signed)
OB Progress Note  S: Olivia Alvarez is feeling well this morning. Husband Joe at the bedside. She was able to repeat back to me the surgery that she is having today and consents to move forward with surgery.   O: Today's Vitals   10/30/23 0818 10/30/23 0855  BP: 120/82   Pulse: 86   Resp: 15   Temp: 98.5 F (36.9 C)   TempSrc: Oral   SpO2: 97%   Weight:  70.3 kg  Height:  5\' 6"  (1.676 m)  PainSc:  0-No pain   Body mass index is 25.02 kg/m.   Well appearing Well perfused No increased WOB Normal ROM of extremities A+Ox3  A/P: No changes since H&P. Move forward with surgery as planned.

## 2023-10-31 ENCOUNTER — Encounter (HOSPITAL_COMMUNITY): Payer: Self-pay | Admitting: Obstetrics and Gynecology

## 2023-10-31 ENCOUNTER — Other Ambulatory Visit (HOSPITAL_COMMUNITY): Payer: Self-pay

## 2023-11-03 ENCOUNTER — Emergency Department (HOSPITAL_COMMUNITY): Payer: Self-pay

## 2023-11-03 ENCOUNTER — Inpatient Hospital Stay (HOSPITAL_COMMUNITY)
Admission: EM | Admit: 2023-11-03 | Discharge: 2023-11-05 | DRG: 176 | Disposition: A | Discharge status: Home / Self Care | Payer: Self-pay | Attending: Internal Medicine | Admitting: Internal Medicine

## 2023-11-03 ENCOUNTER — Other Ambulatory Visit: Payer: Self-pay

## 2023-11-03 ENCOUNTER — Encounter (HOSPITAL_COMMUNITY): Payer: Self-pay | Admitting: *Deleted

## 2023-11-03 DIAGNOSIS — D509 Iron deficiency anemia, unspecified: Secondary | ICD-10-CM | POA: Diagnosis present

## 2023-11-03 DIAGNOSIS — Z833 Family history of diabetes mellitus: Secondary | ICD-10-CM

## 2023-11-03 DIAGNOSIS — F1123 Opioid dependence with withdrawal: Secondary | ICD-10-CM | POA: Diagnosis present

## 2023-11-03 DIAGNOSIS — Z90710 Acquired absence of both cervix and uterus: Secondary | ICD-10-CM

## 2023-11-03 DIAGNOSIS — R52 Pain, unspecified: Secondary | ICD-10-CM

## 2023-11-03 DIAGNOSIS — Z8249 Family history of ischemic heart disease and other diseases of the circulatory system: Secondary | ICD-10-CM

## 2023-11-03 DIAGNOSIS — D649 Anemia, unspecified: Secondary | ICD-10-CM | POA: Diagnosis present

## 2023-11-03 DIAGNOSIS — F1121 Opioid dependence, in remission: Secondary | ICD-10-CM

## 2023-11-03 DIAGNOSIS — M7989 Other specified soft tissue disorders: Secondary | ICD-10-CM

## 2023-11-03 DIAGNOSIS — I2699 Other pulmonary embolism without acute cor pulmonale: Principal | ICD-10-CM | POA: Diagnosis present

## 2023-11-03 DIAGNOSIS — I2694 Multiple subsegmental pulmonary emboli without acute cor pulmonale: Principal | ICD-10-CM

## 2023-11-03 DIAGNOSIS — D500 Iron deficiency anemia secondary to blood loss (chronic): Secondary | ICD-10-CM

## 2023-11-03 LAB — BASIC METABOLIC PANEL
Anion gap: 7 (ref 5–15)
BUN: 9 mg/dL (ref 6–20)
CO2: 26 mmol/L (ref 22–32)
Calcium: 8.6 mg/dL — ABNORMAL LOW (ref 8.9–10.3)
Chloride: 104 mmol/L (ref 98–111)
Creatinine, Ser: 0.6 mg/dL (ref 0.44–1.00)
GFR, Estimated: >60 mL/min (ref >60)
Glucose, Bld: 115 mg/dL — ABNORMAL HIGH (ref 70–99)
Potassium: 3.5 mmol/L (ref 3.5–5.1)
Sodium: 137 mmol/L (ref 135–145)

## 2023-11-03 LAB — SURGICAL PATHOLOGY

## 2023-11-03 LAB — TROPONIN I (HIGH SENSITIVITY)
Troponin I (High Sensitivity): <2 ng/L (ref <18)
Troponin I (High Sensitivity): <2 ng/L (ref <18)

## 2023-11-03 LAB — CBC WITH DIFFERENTIAL/PLATELET
Abs Immature Granulocytes: 0.01 10*3/uL (ref 0.00–0.07)
Basophils Absolute: 0 10*3/uL (ref 0.0–0.1)
Basophils Relative: 0 %
Eosinophils Absolute: 0.3 10*3/uL (ref 0.0–0.5)
Eosinophils Relative: 6 %
HCT: 28.3 % — ABNORMAL LOW (ref 36.0–46.0)
Hemoglobin: 8.1 g/dL — ABNORMAL LOW (ref 12.0–15.0)
Immature Granulocytes: 0 %
Lymphocytes Relative: 34 %
Lymphs Abs: 1.6 10*3/uL (ref 0.7–4.0)
MCH: 22.4 pg — ABNORMAL LOW (ref 26.0–34.0)
MCHC: 28.6 g/dL — ABNORMAL LOW (ref 30.0–36.0)
MCV: 78.4 fL — ABNORMAL LOW (ref 80.0–100.0)
Monocytes Absolute: 0.2 10*3/uL (ref 0.1–1.0)
Monocytes Relative: 5 %
Neutro Abs: 2.6 10*3/uL (ref 1.7–7.7)
Neutrophils Relative %: 55 %
Platelets: 295 10*3/uL (ref 150–400)
RBC: 3.61 MIL/uL — ABNORMAL LOW (ref 3.87–5.11)
RDW: 24.1 % — ABNORMAL HIGH (ref 11.5–15.5)
WBC: 4.7 10*3/uL (ref 4.0–10.5)
nRBC: 0 % (ref 0.0–0.2)

## 2023-11-03 LAB — BRAIN NATRIURETIC PEPTIDE: B Natriuretic Peptide: 97.8 pg/mL (ref 0.0–100.0)

## 2023-11-03 MED ORDER — ONDANSETRON HCL 4 MG/2ML IJ SOLN
4.0000 mg | Freq: Once | INTRAMUSCULAR | Status: AC
Start: 2023-11-03 — End: 2023-11-03
  Administered 2023-11-03: 4 mg via INTRAVENOUS
  Filled 2023-11-03: qty 2

## 2023-11-03 MED ORDER — ONDANSETRON HCL 4 MG/2ML IJ SOLN: 4.0000 mg | Freq: Four times a day (QID) | INTRAMUSCULAR | Status: DC | PRN

## 2023-11-03 MED ORDER — DIAZEPAM 5 MG/ML IJ SOLN
2.5000 mg | Freq: Once | INTRAMUSCULAR | Status: AC
  Administered 2023-11-03: 2.5 mg via INTRAVENOUS
  Filled 2023-11-03: qty 2

## 2023-11-03 MED ORDER — OXYCODONE HCL 5 MG PO TABS
10.0000 mg | ORAL_TABLET | Freq: Four times a day (QID) | ORAL | Status: DC | PRN
  Administered 2023-11-03 – 2023-11-05 (×7): 10 mg via ORAL
  Filled 2023-11-03 (×7): qty 2

## 2023-11-03 MED ORDER — ONDANSETRON HCL 4 MG PO TABS: 4.0000 mg | ORAL_TABLET | Freq: Four times a day (QID) | ORAL | Status: DC | PRN

## 2023-11-03 MED ORDER — IBUPROFEN 200 MG PO TABS
400.0000 mg | ORAL_TABLET | Freq: Four times a day (QID) | ORAL | Status: DC | PRN
Start: 2023-11-03 — End: 2023-11-05
  Administered 2023-11-04 – 2023-11-05 (×2): 400 mg via ORAL
  Filled 2023-11-03 (×2): qty 2

## 2023-11-03 MED ORDER — HEPARIN (PORCINE) 25000 UT/250ML-% IV SOLN
1850.0000 [IU]/h | INTRAVENOUS | Status: DC
  Administered 2023-11-03: 1200 [IU]/h via INTRAVENOUS
  Administered 2023-11-04: 1600 [IU]/h via INTRAVENOUS
  Administered 2023-11-05: 1850 [IU]/h via INTRAVENOUS
  Filled 2023-11-03 (×3): qty 250

## 2023-11-03 MED ORDER — DOCUSATE SODIUM 100 MG PO CAPS
100.0000 mg | ORAL_CAPSULE | Freq: Two times a day (BID) | ORAL | Status: DC
  Administered 2023-11-03 – 2023-11-04 (×2): 100 mg via ORAL
  Filled 2023-11-03 (×5): qty 1

## 2023-11-03 MED ORDER — ACETAMINOPHEN 325 MG PO TABS
650.0000 mg | ORAL_TABLET | Freq: Four times a day (QID) | ORAL | Status: DC
  Administered 2023-11-03 – 2023-11-05 (×7): 650 mg via ORAL
  Filled 2023-11-03 (×8): qty 2

## 2023-11-03 MED ORDER — MELATONIN 3 MG PO TABS: 3.0000 mg | ORAL_TABLET | Freq: Every evening | ORAL | Status: DC | PRN

## 2023-11-03 MED ORDER — MORPHINE SULFATE (PF) 4 MG/ML IV SOLN
4.0000 mg | Freq: Once | INTRAVENOUS | Status: AC
  Administered 2023-11-03: 4 mg via INTRAVENOUS
  Filled 2023-11-03: qty 1

## 2023-11-03 MED ORDER — IOHEXOL 350 MG/ML SOLN
80.0000 mL | Freq: Once | INTRAVENOUS | Status: AC | PRN
  Administered 2023-11-03: 80 mL via INTRAVENOUS

## 2023-11-03 MED ORDER — FERROUS SULFATE 325 (65 FE) MG PO TABS
325.0000 mg | ORAL_TABLET | Freq: Every day | ORAL | Status: DC
  Administered 2023-11-04 – 2023-11-05 (×2): 325 mg via ORAL
  Filled 2023-11-03 (×2): qty 1

## 2023-11-03 MED ORDER — HYDROMORPHONE HCL 1 MG/ML IJ SOLN
0.5000 mg | INTRAMUSCULAR | Status: DC | PRN
  Administered 2023-11-04 (×2): 0.5 mg via INTRAVENOUS
  Filled 2023-11-03 (×2): qty 0.5

## 2023-11-03 MED ORDER — POTASSIUM CHLORIDE 20 MEQ PO PACK
20.0000 meq | PACK | Freq: Once | ORAL | Status: AC
  Administered 2023-11-03: 20 meq via ORAL
  Filled 2023-11-03: qty 1

## 2023-11-03 MED ORDER — HYDROMORPHONE HCL 1 MG/ML IJ SOLN
1.0000 mg | Freq: Once | INTRAMUSCULAR | Status: AC
  Administered 2023-11-03: 1 mg via INTRAVENOUS
  Filled 2023-11-03: qty 1

## 2023-11-03 NOTE — Progress Notes (Signed)
BLE venous duplex has been completed.  Preliminary results given to Dr. Particia Nearing.   Results can be found under chart review under CV PROC. 11/03/2023 5:02 PM Saretta Dahlem RVT, RDMS

## 2023-11-03 NOTE — ED Triage Notes (Signed)
From OB/GYN office, Hysterectomy on Thursday with pain and swelling in rt leg and shob. Pt called office on weekend and was told to come today for follow up. Pt noted to be shob in conversation.

## 2023-11-03 NOTE — Progress Notes (Addendum)
PHARMACY - ANTICOAGULATION CONSULT NOTE  Pharmacy Consult for heparin Indication: acute pulmonary embolus  Allergies  Allergen Reactions   Sulfa Antibiotics Other (See Comments)    UNKNOWN    Patient Measurements: Height: 5\' 6"  (167.6 cm) Weight: 70.3 kg (155 lb) IBW/kg (Calculated) : 59.3 Heparin Dosing Weight: 70 kg  Vital Signs: Temp: 98.9 F (37.2 C) (11/18 1606) Temp Source: Oral (11/18 1606) BP: 109/65 (11/18 1900) Pulse Rate: 79 (11/18 1900)  Labs: Recent Labs    11/03/23 1639  HGB 8.1*  HCT 28.3*  PLT 295  CREATININE 0.60    Estimated Creatinine Clearance: 87.5 mL/min (by C-G formula based on SCr of 0.6 mg/dL).   Medical History: Past Medical History:  Diagnosis Date   Anemia    Anxiety    no meds   Dental crown present    Depression    no meds   Distal radius fracture, right 12/2016   displaced/comminuted   History of MRSA infection    arm    Assessment: Patient is a 42 y.o F with hx abnormal uterine bleeding who underwent total hysterectomy with bilateral salpingectomy with excision of left vulvar lesion on 10/30/23, presented to the ED on 11/03/23 with c/o SOB and RLE swelling.  LE doppler on 11/03/23 was negative for DVT. Chest CT on 11/03/23 showed, "nonocclusive right lower lobe pulmonary artery emboli" with no evidence of RHS.  - per Dr. Particia Nearing via [place] msg, no bolus for heparin drip d/t recent surgical procedure.   Today, 11/03/2023: - hgb 8.1, plts 295K - scr <1  Goal of Therapy:  Heparin level 0.3-0.7 units/ml Monitor platelets by anticoagulation protocol: Yes   Plan:  - heparin drip at 1200 units/hr - check 6 hr heparin level  - monitor for s/sx bleeding   Consuela Widener P 11/03/2023,7:47 PM

## 2023-11-03 NOTE — H&P (Signed)
History and Physical    Patient: Olivia Alvarez WUJ:[id number] DOB: Jul 07, 1981 DOA: 11/03/2023 DOS: the patient was seen and examined on 11/03/2023 PCP: Patient, No Pcp Per  Patient coming from: Home  Chief Complaint:  Chief Complaint  Patient presents with   Leg Swelling   Shortness of Breath   HPI: Olivia Alvarez is a 42 y.o. female with medical history significant for laparoscopic hysterectomy 4 days ago.  The patient tells me her uterus was the size of a watermelon and had to be cut up to be removed.  The surgery took approximately 5 hours it was very involved.  She was very tender and groggy after the surgery.  She was discharged to home and the following morning she noticed that her right leg was swollen.  She was having some pain in her chest but she was in a lot of pain everywhere.  Today she had her first post surgery follow-up appointment.  She reported that she is having more pain in her chest then hit in her abdomen.  She is intermittently short of breath and the swelling of her right leg has not gone down.  Her OB doc sent her to the emergency department concerned about PE and the patient had a CT scan which confirmed that she has pulmonary is embolism in her right lower lung. In the emergency department the patient was started on IV heparin and the hospitalist will be admitting her.   Review of Systems: As mentioned in the history of present illness. All other systems reviewed and are negative. Past Medical History:  Diagnosis Date   Anemia    Anxiety    no meds   Dental crown present    Depression    no meds   Distal radius fracture, right 12/2016   displaced/comminuted   History of MRSA infection    arm   Past Surgical History:  Procedure Laterality Date   CYSTOSCOPY N/A 10/30/2023   Procedure: CYSTOSCOPY, EXCISION LEFT VULVAR LESION;  Surgeon: Willa Frater, MD;  Location: [facility];  Service: Gynecology;  Laterality: N/A;   OPEN REDUCTION  INTERNAL FIXATION (ORIF) DISTAL RADIAL FRACTURE Right 12/26/2016   Procedure: OPEN REDUCTION INTERNAL FIXATION (ORIF) DISTAL RADIAL FRACTURE;  Surgeon: Betha Loa, MD;  Location: [facility];  Service: Orthopedics;  Laterality: Right;   ROBOTIC ASSISTED TOTAL HYSTERECTOMY WITH BILATERAL SALPINGO OOPHERECTOMY N/A 10/30/2023   Procedure: XI ROBOTIC ASSISTED TOTAL HYSTERECTOMY WITH BILATERAL SALPINGECTOMY;  Surgeon: Willa Frater, MD;  Location: [facility];  Service: Gynecology;  Laterality: N/A;  per dr Clint Lipps: "please consider TAP block"   TUBAL LIGATION Bilateral 02/15/2016   Procedure: POST PARTUM TUBAL LIGATION;  Surgeon: Willodean Rosenthal, MD;  Location: [facility];  Service: Gynecology;  Laterality: Bilateral;   Social History:  reports that she has never smoked. She has never used smokeless tobacco. She reports that she does not currently use alcohol. She reports that she does not use drugs.  Allergies  Allergen Reactions   Sulfa Antibiotics Other (See Comments)    UNKNOWN    Family History  Problem Relation Age of Onset   Cancer Father        throat   Heart disease Maternal Grandmother    Cancer Maternal Grandfather    Heart disease Maternal Grandfather    Diabetes Maternal Grandfather    Cancer Paternal Grandfather        lung    Prior to Admission medications   Medication Sig Start Date  End Date Taking? Authorizing Provider  acetaminophen (TYLENOL) 500 MG tablet Take 2 tablets (1,000 mg total) by mouth every 6 (six) hours as needed. 12/14/16  Yes Arby Barrette, MD  Ascorbic Acid (VITAMIN C PO) Take 1 tablet by mouth daily.   Yes [provider]  cyclobenzaprine (FLEXERIL) 5 MG tablet Take 5 mg by mouth 3 (three) times daily as needed. 10/31/23  Yes [provider]  ferrous sulfate 325 (65 FE) MG tablet Take 1 tablet (325 mg total) by mouth 2 (two) times daily before a meal. Patient taking differently: Take 325 mg by mouth daily. 10/05/23   Yes Shalhoub, Deno Lunger, MD  IBUPROFEN PO Take 2 tablets by mouth as needed (pain).   Yes [provider]  Norgestimate-Eth Estradiol (SPRINTEC 28 PO) Take 1 tablet by mouth daily.   Yes [provider]  oxycodone (OXY-IR) 5 MG capsule Take 5 mg by mouth every 4 (four) hours as needed for pain.   Yes [provider]    Physical Exam: Vitals:   11/03/23 1900 11/03/23 1930 11/03/23 2014 11/03/23 2241  BP: 109/65 110/62  (!) 143/79  Pulse: 79 84  70  Resp: 10 11  (!) 21  Temp:   98.8 F (37.1 C) 97.8 F (36.6 C)  TempSrc:   Oral Oral  SpO2: 100% 100%  100%  Weight:      Height:       Physical Exam:  General: No acute distress, well developed, well nourished HEENT: Normocephalic, atraumatic, PERRL Cardiovascular: Normal rate and rhythm. Distal pulses intact. Pulmonary: Normal pulmonary effort, normal breath sounds Gastrointestinal: Nondistended abdomen, soft, LAP sites C/D/I, tender, normoactive bowel sounds Musculoskeletal:Normal ROM, No pitting edema but right lower ext is larger than left. Lymphadenopathy: No cervical LAD. Skin: Skin is warm and dry. Neuro: No focal deficits noted, AAOx3. PSYCH: Attentive and cooperative  Data Reviewed:  Results for orders placed or performed during the hospital encounter of 11/03/23 (from the past 24 hour(s))  Basic metabolic panel     Status: Abnormal   Collection Time: 11/03/23  4:39 PM  Result Value Ref Range   Sodium 137 135 - 145 mmol/L   Potassium 3.5 3.5 - 5.1 mmol/L   Chloride 104 98 - 111 mmol/L   CO2 26 22 - 32 mmol/L   Glucose, Bld 115 (H) 70 - 99 mg/dL   BUN 9 6 - 20 mg/dL   Creatinine, Ser 8.29 0.44 - 1.00 mg/dL   Calcium 8.6 (L) 8.9 - 10.3 mg/dL   GFR, Estimated >56 >21 mL/min   Anion gap 7 5 - 15  Brain natriuretic peptide     Status: None   Collection Time: 11/03/23  4:39 PM  Result Value Ref Range   B Natriuretic Peptide 97.8 0.0 - 100.0 pg/mL  CBC with Differential     Status: Abnormal    Collection Time: 11/03/23  4:39 PM  Result Value Ref Range   WBC 4.7 4.0 - 10.5 K/uL   RBC 3.61 (L) 3.87 - 5.11 MIL/uL   Hemoglobin 8.1 (L) 12.0 - 15.0 g/dL   HCT 30.8 (L) 65.7 - 84.6 %   MCV 78.4 (L) 80.0 - 100.0 fL   MCH 22.4 (L) 26.0 - 34.0 pg   MCHC 28.6 (L) 30.0 - 36.0 g/dL   RDW 96.2 (H) 95.2 - 84.1 %   Platelets 295 150 - 400 K/uL   nRBC 0.0 0.0 - 0.2 %   Neutrophils Relative % 55 %  Neutro Abs 2.6 1.7 - 7.7 K/uL   Lymphocytes Relative 34 %   Lymphs Abs 1.6 0.7 - 4.0 K/uL   Monocytes Relative 5 %   Monocytes Absolute 0.2 0.1 - 1.0 K/uL   Eosinophils Relative 6 %   Eosinophils Absolute 0.3 0.0 - 0.5 K/uL   Basophils Relative 0 %   Basophils Absolute 0.0 0.0 - 0.1 K/uL   Immature Granulocytes 0 %   Abs Immature Granulocytes 0.01 0.00 - 0.07 K/uL   Polychromasia PRESENT   Troponin I (High Sensitivity)     Status: None   Collection Time: 11/03/23  8:00 PM  Result Value Ref Range   Troponin I (High Sensitivity) <2 <18 ng/L  Troponin I (High Sensitivity)     Status: None   Collection Time: 11/03/23 10:47 PM  Result Value Ref Range   Troponin I (High Sensitivity) <2 <18 ng/L   CTA IMPRESSION: 1. Nonocclusive right lower lobe pulmonary artery emboli. No CT evidence of right heart straining. 2. Pneumoperitoneum, likely related to recent surgery.  Assessment and Plan:  PE right lower lobe, post hysterectomy - IV heparin started.  Transition to DOAC.  2. Anemia -  She is anemic from her recent dysfunctional uterine bleeding.   - Monitor hemoglobin.   - Continue daily iron   3. H/o Opioid abuse -she is requiring high-dose IV narcotics.  Monitor and taper.   Advance Care Planning:   Code Status: Full Code the patient wants to be full code and names her husband as her surrogate decision-maker.  Consults: none  Family Communication: Patient's husband at the bedside  Severity of Illness: The appropriate patient status for this patient is INPATIENT. Inpatient status  is judged to be reasonable and necessary in order to provide the required intensity of service to ensure the patient's safety. The patient's presenting symptoms, physical exam findings, and initial radiographic and laboratory data in the context of their chronic comorbidities is felt to place them at high risk for further clinical deterioration. Furthermore, it is not anticipated that the patient will be medically stable for discharge from the hospital within 2 midnights of admission.   * I certify that at the point of admission it is my clinical judgment that the patient will require inpatient hospital care spanning beyond 2 midnights from the point of admission due to high intensity of service, high risk for further deterioration and high frequency of surveillance required.*  Author: Buena Irish, MD 11/03/2023 11:41 PM  For on call review [email or web address].

## 2023-11-03 NOTE — ED Provider Notes (Signed)
[place] EMERGENCY DEPARTMENT AT [facility] Provider Note   CSN: [id number] Arrival date & time: 11/03/23  1553     History  Chief Complaint  Patient presents with   Leg Swelling   Shortness of Breath    Olivia Alvarez is a 42 y.o. female.  Pt is a 42 yo female with pmhx significant for anxiety, depression, and DUB.  Pt had a hysterectomy on 11/14 and has been having some leg swelling and sob since then.  She went for a follow up with her obgyn today and there was concern for DVT and possible PE, so she was sent here for further eval.       Home Medications Prior to Admission medications   Medication Sig Start Date End Date Taking? Authorizing Provider  acetaminophen (TYLENOL) 500 MG tablet Take 2 tablets (1,000 mg total) by mouth every 6 (six) hours as needed. 12/14/16  Yes Arby Barrette, MD  Ascorbic Acid (VITAMIN C PO) Take 1 tablet by mouth daily.   Yes [provider]  cyclobenzaprine (FLEXERIL) 5 MG tablet Take 5 mg by mouth 3 (three) times daily as needed. 10/31/23  Yes [provider]  ferrous sulfate 325 (65 FE) MG tablet Take 1 tablet (325 mg total) by mouth 2 (two) times daily before a meal. Patient taking differently: Take 325 mg by mouth daily. 10/05/23  Yes Shalhoub, Deno Lunger, MD  IBUPROFEN PO Take 2 tablets by mouth as needed (pain).   Yes [provider]  Norgestimate-Eth Estradiol (SPRINTEC 28 PO) Take 1 tablet by mouth daily.   Yes [provider]  oxycodone (OXY-IR) 5 MG capsule Take 5 mg by mouth every 4 (four) hours as needed for pain.   Yes [provider]      Allergies    Sulfa antibiotics    Review of Systems   Review of Systems  Respiratory:  Positive for shortness of breath.   Cardiovascular:  Positive for leg swelling.  All other systems reviewed and are negative.   Physical Exam Updated Vital Signs BP 110/62   Pulse 84   Temp 98.8 F (37.1 C) (Oral)   Resp 11   Ht 5'  6" (1.676 m)   Wt 70.3 kg   LMP 10/03/2023 (Approximate)   SpO2 100%   BMI 25.02 kg/m  Physical Exam Vitals and nursing note reviewed.  Constitutional:      Appearance: She is well-developed.  HENT:     Head: Normocephalic and atraumatic.     Mouth/Throat:     Mouth: Mucous membranes are moist.     Pharynx: Oropharynx is clear.  Eyes:     Extraocular Movements: Extraocular movements intact.     Pupils: Pupils are equal, round, and reactive to light.  Cardiovascular:     Rate and Rhythm: Normal rate and regular rhythm.  Pulmonary:     Effort: Tachypnea present.     Breath sounds: Normal breath sounds.  Abdominal:     General: Bowel sounds are normal.     Palpations: Abdomen is soft.  Musculoskeletal:     Cervical back: Normal range of motion and neck supple.     Right lower leg: Edema present.     Left lower leg: Edema present.  Skin:    General: Skin is warm.     Capillary Refill: Capillary refill takes less than 2 seconds.  Neurological:     General: No focal deficit present.     Mental Status: She  is alert and oriented to person, place, and time.  Psychiatric:        Mood and Affect: Mood normal.        Behavior: Behavior normal.     ED Results / Procedures / Treatments   Labs (all labs ordered are listed, but only abnormal results are displayed) Labs Reviewed  BASIC METABOLIC PANEL - Abnormal; Notable for the following components:      Result Value   Glucose, Bld 115 (*)    Calcium 8.6 (*)    All other components within normal limits  CBC WITH DIFFERENTIAL/PLATELET - Abnormal; Notable for the following components:   RBC 3.61 (*)    Hemoglobin 8.1 (*)    HCT 28.3 (*)    MCV 78.4 (*)    MCH 22.4 (*)    MCHC 28.6 (*)    RDW 24.1 (*)    All other components within normal limits  BRAIN NATRIURETIC PEPTIDE  HEPARIN LEVEL (UNFRACTIONATED)  CBC  TROPONIN I (HIGH SENSITIVITY)    EKG EKG Interpretation Date/Time:  Monday November 03 2023 16:03:29  EST Ventricular Rate:  84 PR Interval:  120 QRS Duration:  87 QT Interval:  368 QTC Calculation: 435 R Axis:   65  Text Interpretation: Sinus rhythm No old tracing to compare Confirmed by Jacalyn Lefevre [phone]) on 11/03/2023 4:15:48 PM  Radiology CT Angio Chest PE W and/or Wo Contrast  Result Date: 11/03/2023 CLINICAL DATA:  Concern for pulmonary disease.  Recent hysterectomy. EXAM: CT ANGIOGRAPHY CHEST WITH CONTRAST TECHNIQUE: Multidetector CT imaging of the chest was performed using the standard protocol during bolus administration of intravenous contrast. Multiplanar CT image reconstructions and MIPs were obtained to evaluate the vascular anatomy. RADIATION DOSE REDUCTION: This exam was performed according to the departmental dose-optimization program which includes automated exposure control, adjustment of the mA and/or kV according to patient size and/or use of iterative reconstruction technique. CONTRAST:  80mL OMNIPAQUE IOHEXOL 350 MG/ML SOLN COMPARISON:  CT abdomen pelvis dated 10/03/2023 and chest radiograph dated 10/03/2023. FINDINGS: Cardiovascular: There is no cardiomegaly or pericardial effusion. The thoracic aorta is unremarkable. The origins of the great vessels of the aortic arch appear patent. Nonocclusive right lower lobe pulmonary artery emboli involving the segmental and subsegmental branches. No CT evidence of right heart straining. Mediastinum/Nodes: No hilar or mediastinal adenopathy. The esophagus is grossly unremarkable. No mediastinal fluid collection. Lungs/Pleura: Bibasilar streaky atelectasis. There is no pleural effusion or pneumothorax. The central airways are patent. Upper Abdomen: Pneumoperitoneum, likely related to recent surgery. There is extension of air into the subcutaneous soft tissues of the abdominal wall. Musculoskeletal: No acute osseous pathology. Review of the MIP images confirms the above findings. IMPRESSION: 1. Nonocclusive right lower lobe pulmonary  artery emboli. No CT evidence of right heart straining. 2. Pneumoperitoneum, likely related to recent surgery. These results were called by telephone at the time of interpretation on 11/03/2023 at 7:27 pm to provider [facility] , who verbally acknowledged these results. Electronically Signed   By: Elgie Collard M.D.   On: 11/03/2023 19:47   VAS [place] LOWER EXTREMITY VENOUS (DVT) (ONLY [facility] & [facility])  Result Date: 11/03/2023  Lower Venous DVT Study Patient Name:  VIVIENE WAECHTER  Date of Exam:   11/03/2023 Medical Rec #: [id number]             Accession #:    [id number] Date of Birth: 1981-01-14             Patient Gender: F  Patient Age:   71 years Exam Location:  [facility] Procedure:      VAS [place] LOWER EXTREMITY VENOUS (DVT) Referring Phys: Handy Mcloud --------------------------------------------------------------------------------  Indications: Swelling.  Risk Factors: Hysterectomy 10/30/2023. Comparison Study: Previous exam on 10/03/2023 was negative for DVT Performing Technologist: Ernestene Mention RVT, RDMS  Examination Guidelines: A complete evaluation includes B-mode imaging, spectral Doppler, color Doppler, and power Doppler as needed of all accessible portions of each vessel. Bilateral testing is considered an integral part of a complete examination. Limited examinations for reoccurring indications may be performed as noted. The reflux portion of the exam is performed with the patient in reverse Trendelenburg.  +---------+---------------+---------+-----------+----------+--------------+ RIGHT    CompressibilityPhasicitySpontaneityPropertiesThrombus Aging +---------+---------------+---------+-----------+----------+--------------+ CFV      Full           Yes      Yes                                 +---------+---------------+---------+-----------+----------+--------------+ SFJ      Full                                                         +---------+---------------+---------+-----------+----------+--------------+ FV Prox  Full           Yes      Yes                                 +---------+---------------+---------+-----------+----------+--------------+ FV Mid   Full           Yes      Yes                                 +---------+---------------+---------+-----------+----------+--------------+ FV DistalFull           Yes      Yes                                 +---------+---------------+---------+-----------+----------+--------------+ PFV      Full                                                        +---------+---------------+---------+-----------+----------+--------------+ POP      Full           Yes      Yes                                 +---------+---------------+---------+-----------+----------+--------------+ PTV      Full                                                        +---------+---------------+---------+-----------+----------+--------------+ PERO     Full                                                        +---------+---------------+---------+-----------+----------+--------------+   +---------+---------------+---------+-----------+----------+--------------+  LEFT     CompressibilityPhasicitySpontaneityPropertiesThrombus Aging +---------+---------------+---------+-----------+----------+--------------+ CFV      Full           Yes      Yes                                 +---------+---------------+---------+-----------+----------+--------------+ SFJ      Full                                                        +---------+---------------+---------+-----------+----------+--------------+ FV Prox  Full           Yes      Yes                                 +---------+---------------+---------+-----------+----------+--------------+ FV Mid   Full           Yes      Yes                                  +---------+---------------+---------+-----------+----------+--------------+ FV DistalFull           Yes      Yes                                 +---------+---------------+---------+-----------+----------+--------------+ PFV      Full                                                        +---------+---------------+---------+-----------+----------+--------------+ POP      Full           Yes      Yes                                 +---------+---------------+---------+-----------+----------+--------------+ PTV      Full                                                        +---------+---------------+---------+-----------+----------+--------------+ PERO     Full                                                        +---------+---------------+---------+-----------+----------+--------------+     Summary: BILATERAL: - No evidence of deep vein thrombosis seen in the lower extremities, bilaterally. -No evidence of popliteal cyst, bilaterally.   *See table(s) above for measurements and observations.    Preliminary     Procedures Procedures    Medications Ordered in ED Medications  heparin ADULT infusion 100 units/mL (25000 units/213mL) (1,200 Units/hr Intravenous New Bag/Given  11/03/23 2011)  morphine (PF) 4 MG/ML injection 4 mg (4 mg Intravenous Given 11/03/23 1622)  ondansetron (ZOFRAN) injection 4 mg (4 mg Intravenous Given 11/03/23 1622)  diazepam (VALIUM) injection 2.5 mg (2.5 mg Intravenous Given 11/03/23 1622)  iohexol (OMNIPAQUE) 350 MG/ML injection 80 mL (80 mLs Intravenous Contrast Given 11/03/23 1754)    ED Course/ Medical Decision Making/ A&P                                 Medical Decision Making Amount and/or Complexity of Data Reviewed Labs: ordered. Radiology: ordered.  Risk Prescription drug management. Decision regarding hospitalization.   This patient presents to the ED for concern of sob, this involves an extensive number of treatment  options, and is a complaint that carries with it a high risk of complications and morbidity.  The differential diagnosis includes PE, CHF, pna   Co morbidities that complicate the patient evaluation  anxiety, depression, and DUB   Additional history obtained:  Additional history obtained from epic chart review External records from outside source obtained and reviewed including obgyn   Lab Tests:  I Ordered, and personally interpreted labs.  The pertinent results include:  cbc with hgb low at 8.1 (hgb 9.2 last week); bmp nl, bnp nl   Imaging Studies ordered:  I ordered imaging studies including [place] and ct chest  I independently visualized and interpreted imaging which showed  [place]: BILATERAL:  - No evidence of deep vein thrombosis seen in the lower extremities, bilaterally.  -No evidence of popliteal cyst, bilaterally.  CT chest:  Nonocclusive right lower lobe pulmonary artery emboli. No CT  evidence of right heart straining.  2. Pneumoperitoneum, likely related to recent surgery.   I agree with the radiologist interpretation   Cardiac Monitoring:  The patient was maintained on a cardiac monitor.  I personally viewed and interpreted the cardiac monitored which showed an underlying rhythm of: nsr   Medicines ordered and prescription drug management:  I ordered medication including morphine/zofran/valium  for sx  Reevaluation of the patient after these medicines showed that the patient improved I have reviewed the patients home medicines and have made adjustments as needed   Test Considered:  ct   Critical Interventions:  heparin   Consultations Obtained:  I requested consultation with the hospitalist (Dr. Gasper Sells),  and discussed lab and imaging findings as well as pertinent plan - she will admit Pt d/w [facility] (Dr. Baltazar Apo).  She will see pt in the am.   Problem List / ED Course:  PE: pt started on heparin Anemia:  hgb down from 9.2 (prior  to surgery).  However, she's been as low as 4.3 in the past   Reevaluation:  After the interventions noted above, I reevaluated the patient and found that they have :improved   Social Determinants of Health:  Lives at home   Dispostion:  After consideration of the diagnostic results and the patients response to treatment, I feel that the patent would benefit from admission.          Final Clinical Impression(s) / ED Diagnoses Final diagnoses:  Multiple subsegmental pulmonary emboli without acute cor pulmonale (HCC)  Anemia, unspecified type    Rx / DC Orders ED Discharge Orders     None         Jacalyn Lefevre, MD 11/03/23 2017

## 2023-11-04 LAB — CBC
HCT: 25.9 % — ABNORMAL LOW (ref 36.0–46.0)
Hemoglobin: 7.4 g/dL — ABNORMAL LOW (ref 12.0–15.0)
MCH: 22.4 pg — ABNORMAL LOW (ref 26.0–34.0)
MCHC: 28.6 g/dL — ABNORMAL LOW (ref 30.0–36.0)
MCV: 78.2 fL — ABNORMAL LOW (ref 80.0–100.0)
Platelets: 267 10*3/uL (ref 150–400)
RBC: 3.31 MIL/uL — ABNORMAL LOW (ref 3.87–5.11)
RDW: 24 % — ABNORMAL HIGH (ref 11.5–15.5)
WBC: 4 10*3/uL (ref 4.0–10.5)
nRBC: 0 % (ref 0.0–0.2)

## 2023-11-04 LAB — HEPARIN LEVEL (UNFRACTIONATED)
Heparin Unfractionated: <0.1 [IU]/mL — ABNORMAL LOW (ref 0.30–0.70)
Heparin Unfractionated: 0.13 [IU]/mL — ABNORMAL LOW (ref 0.30–0.70)
Heparin Unfractionated: 0.26 [IU]/mL — ABNORMAL LOW (ref 0.30–0.70)

## 2023-11-04 LAB — BASIC METABOLIC PANEL
Anion gap: 7 (ref 5–15)
BUN: 9 mg/dL (ref 6–20)
CO2: 27 mmol/L (ref 22–32)
Calcium: 8.7 mg/dL — ABNORMAL LOW (ref 8.9–10.3)
Chloride: 104 mmol/L (ref 98–111)
Creatinine, Ser: 0.68 mg/dL (ref 0.44–1.00)
GFR, Estimated: >60 mL/min (ref >60)
Glucose, Bld: 101 mg/dL — ABNORMAL HIGH (ref 70–99)
Potassium: 3.8 mmol/L (ref 3.5–5.1)
Sodium: 138 mmol/L (ref 135–145)

## 2023-11-04 LAB — MAGNESIUM: Magnesium: 1.8 mg/dL (ref 1.7–2.4)

## 2023-11-04 MED ORDER — MORPHINE SULFATE (PF) 2 MG/ML IV SOLN
1.0000 mg | INTRAVENOUS | Status: DC | PRN
  Administered 2023-11-04: 1 mg via INTRAVENOUS
  Filled 2023-11-04: qty 1

## 2023-11-04 NOTE — Progress Notes (Signed)
PROGRESS NOTE    Olivia Alvarez  ZOX:[id number] DOB: 1981/11/16 DOA: 11/03/2023 PCP: Patient, No Pcp Per   Brief Narrative:  This 42 years old female with PMH significant for laparoscopic hysterectomy 4 days ago presented in the ED with right leg swelling and pain.  Patient reports she was discharged home after surgery and next morning she noticed her leg was swollen.  She also reports having intermittent chest pain associated with shortness of breath.  Her OB/GYN has sent her to the ED and she is found to have pulmonary embolism on CT chest.  Patient is admitted and is started on IV heparin.  Assessment & Plan:   Principal Problem:   Pulmonary emboli (HCC) Active Problems:   Opioid dependence with withdrawal (HCC)   Anemia  Acute right pulmonary embolism: Likely provoked following post hysterectomy. CT chest shows acute PE, No right heart strain. Continue IV heparin. Venous duplex bilateral LE >  No evidence of DVT Plan is to transition to DOAC before discharge.  Microcytic hypochromic anemia: Likely secondary to recent dysfunctional uterine bleeding. Continue iron supplementation. Monitor H&H daily  History of opioid abuse. Patient has been requiring high-dose IV narcotics. Monitor and taper.  Pneumoperitoneum: Pneumoperitoneum likely related to recent surgery. Case discussed with general surgery, states this is postoperative, Continue supportive care.   DVT prophylaxis:  IV heparin Code Status: Full code Family Communication: husband at bed side Disposition Plan:    Status is: Inpatient Remains inpatient appropriate because: Admitted for acute pulmonary embolism,  started on IV heparin.     Consultants:  None  Procedures: CT chest,  Venous duplex  Antimicrobials:  Anti-infectives (From admission, onward)    None       Subjective: Patient was seen and examined at bedside.  Overnight events noted. Patient was sitting comfortably but continued to  complain about pain in the lower back and chest.  Objective: Vitals:   11/04/23 0234 11/04/23 0658 11/04/23 0818 11/04/23 0910  BP: 107/65 112/73 113/70 115/74  Pulse: 68 64 73 75  Resp: 16 16  20   Temp: 98.3 F (36.8 C) 98.2 F (36.8 C) 98.5 F (36.9 C) 98.1 F (36.7 C)  TempSrc: Oral Oral Oral Oral  SpO2: 100% 100% 99% 98%  Weight:      Height:        Intake/Output Summary (Last 24 hours) at 11/04/2023 1234 Last data filed at 11/04/2023 0600 Gross per 24 hour  Intake 359.37 ml  Output --  Net 359.37 ml   Filed Weights   11/03/23 1600  Weight: 70.3 kg    Examination:  General exam: Appears calm and comfortable , not in any acute distress. Respiratory system: Clear to auscultation. Respiratory effort normal.  RR 16 Cardiovascular system: S1 & S2 heard, RRR. No murmer,  No pedal edema. Gastrointestinal system: Abdomen is non distended, soft and non tender. Normal bowel sounds heard. Central nervous system: Alert and oriented x 3. No focal neurological deficits. Extremities: No edema, no cyanosis, no clubbing  Skin: No rashes, lesions or ulcers Psychiatry: Judgement and insight appear normal. Mood & affect appropriate.     Data Reviewed: I have personally reviewed following labs and imaging studies  CBC: Recent Labs  Lab 11/03/23 1639 11/04/23 0356  WBC 4.7 4.0  NEUTROABS 2.6  --   HGB 8.1* 7.4*  HCT 28.3* 25.9*  MCV 78.4* 78.2*  PLT 295 267   Basic Metabolic Panel: Recent Labs  Lab 11/03/23 1639 11/04/23 0356  NA 137  138  K 3.5 3.8  CL 104 104  CO2 26 27  GLUCOSE 115* 101*  BUN 9 9  CREATININE 0.60 0.68  CALCIUM 8.6* 8.7*  MG  --  1.8   GFR: Estimated Creatinine Clearance: 87.5 mL/min (by C-G formula based on SCr of 0.68 mg/dL). Liver Function Tests: No results for input(s): "AST", "ALT", "ALKPHOS", "BILITOT", "PROT", "ALBUMIN" in the last 168 hours. No results for input(s): "LIPASE", "AMYLASE" in the last 168 hours. No results for  input(s): "AMMONIA" in the last 168 hours. Coagulation Profile: No results for input(s): "INR", "PROTIME" in the last 168 hours. Cardiac Enzymes: No results for input(s): "CKTOTAL", "CKMB", "CKMBINDEX", "TROPONINI" in the last 168 hours. BNP (last 3 results) No results for input(s): "PROBNP" in the last 8760 hours. HbA1C: No results for input(s): "HGBA1C" in the last 72 hours. CBG: No results for input(s): "GLUCAP" in the last 168 hours. Lipid Profile: No results for input(s): "CHOL", "HDL", "LDLCALC", "TRIG", "CHOLHDL", "LDLDIRECT" in the last 72 hours. Thyroid Function Tests: No results for input(s): "TSH", "T4TOTAL", "FREET4", "T3FREE", "THYROIDAB" in the last 72 hours. Anemia Panel: No results for input(s): "VITAMINB12", "FOLATE", "FERRITIN", "TIBC", "IRON", "RETICCTPCT" in the last 72 hours. Sepsis Labs: No results for input(s): "PROCALCITON", "LATICACIDVEN" in the last 168 hours.  Recent Results (from the past 240 hour(s))  Surgical pcr screen     Status: None   Collection Time: 10/27/23 11:06 AM   Specimen: Nasal Mucosa; Nasal Swab  Result Value Ref Range Status   MRSA, PCR NEGATIVE NEGATIVE Final   Staphylococcus aureus NEGATIVE NEGATIVE Final    Comment: (NOTE) The Xpert SA Assay (FDA approved for NASAL specimens in patients 31 years of age and older), is one component of a comprehensive surveillance program. It is not intended to diagnose infection nor to guide or monitor treatment. Performed at [facility], [address] [place], [place] [address]     Radiology Studies: VAS [place] LOWER EXTREMITY VENOUS (DVT) (ONLY [facility] & [facility])  Result Date: 11/04/2023  Lower Venous DVT Study Patient Name:  Olivia Alvarez  Date of Exam:   11/03/2023 Medical Rec #: [id number]             Accession #:    [id number] Date of Birth: 07-16-1981             Patient Gender: F Patient Age:   78 years Exam Location:  [facility] Procedure:      VAS [place] LOWER  EXTREMITY VENOUS (DVT) Referring Phys: JULIE HAVILAND --------------------------------------------------------------------------------  Indications: Swelling.  Risk Factors: Hysterectomy 10/30/2023. Comparison Study: Previous exam on 10/03/2023 was negative for DVT Performing Technologist: Ernestene Mention RVT, RDMS  Examination Guidelines: A complete evaluation includes B-mode imaging, spectral Doppler, color Doppler, and power Doppler as needed of all accessible portions of each vessel. Bilateral testing is considered an integral part of a complete examination. Limited examinations for reoccurring indications may be performed as noted. The reflux portion of the exam is performed with the patient in reverse Trendelenburg.  +---------+---------------+---------+-----------+----------+--------------+ RIGHT    CompressibilityPhasicitySpontaneityPropertiesThrombus Aging +---------+---------------+---------+-----------+----------+--------------+ CFV      Full           Yes      Yes                                 +---------+---------------+---------+-----------+----------+--------------+ SFJ      Full                                                        +---------+---------------+---------+-----------+----------+--------------+  FV Prox  Full           Yes      Yes                                 +---------+---------------+---------+-----------+----------+--------------+ FV Mid   Full           Yes      Yes                                 +---------+---------------+---------+-----------+----------+--------------+ FV DistalFull           Yes      Yes                                 +---------+---------------+---------+-----------+----------+--------------+ PFV      Full                                                        +---------+---------------+---------+-----------+----------+--------------+ POP      Full           Yes      Yes                                  +---------+---------------+---------+-----------+----------+--------------+ PTV      Full                                                        +---------+---------------+---------+-----------+----------+--------------+ PERO     Full                                                        +---------+---------------+---------+-----------+----------+--------------+   +---------+---------------+---------+-----------+----------+--------------+ LEFT     CompressibilityPhasicitySpontaneityPropertiesThrombus Aging +---------+---------------+---------+-----------+----------+--------------+ CFV      Full           Yes      Yes                                 +---------+---------------+---------+-----------+----------+--------------+ SFJ      Full                                                        +---------+---------------+---------+-----------+----------+--------------+ FV Prox  Full           Yes      Yes                                 +---------+---------------+---------+-----------+----------+--------------+ FV Mid   Full  Yes      Yes                                 +---------+---------------+---------+-----------+----------+--------------+ FV DistalFull           Yes      Yes                                 +---------+---------------+---------+-----------+----------+--------------+ PFV      Full                                                        +---------+---------------+---------+-----------+----------+--------------+ POP      Full           Yes      Yes                                 +---------+---------------+---------+-----------+----------+--------------+ PTV      Full                                                        +---------+---------------+---------+-----------+----------+--------------+ PERO     Full                                                         +---------+---------------+---------+-----------+----------+--------------+     Summary: BILATERAL: - No evidence of deep vein thrombosis seen in the lower extremities, bilaterally. -No evidence of popliteal cyst, bilaterally.   *See table(s) above for measurements and observations. Electronically signed by Gerarda Fraction on 11/04/2023 at 11:40:21 AM.    Final    CT Angio Chest PE W and/or Wo Contrast  Result Date: 11/03/2023 CLINICAL DATA:  Concern for pulmonary disease.  Recent hysterectomy. EXAM: CT ANGIOGRAPHY CHEST WITH CONTRAST TECHNIQUE: Multidetector CT imaging of the chest was performed using the standard protocol during bolus administration of intravenous contrast. Multiplanar CT image reconstructions and MIPs were obtained to evaluate the vascular anatomy. RADIATION DOSE REDUCTION: This exam was performed according to the departmental dose-optimization program which includes automated exposure control, adjustment of the mA and/or kV according to patient size and/or use of iterative reconstruction technique. CONTRAST:  80mL OMNIPAQUE IOHEXOL 350 MG/ML SOLN COMPARISON:  CT abdomen pelvis dated 10/03/2023 and chest radiograph dated 10/03/2023. FINDINGS: Cardiovascular: There is no cardiomegaly or pericardial effusion. The thoracic aorta is unremarkable. The origins of the great vessels of the aortic arch appear patent. Nonocclusive right lower lobe pulmonary artery emboli involving the segmental and subsegmental branches. No CT evidence of right heart straining. Mediastinum/Nodes: No hilar or mediastinal adenopathy. The esophagus is grossly unremarkable. No mediastinal fluid collection. Lungs/Pleura: Bibasilar streaky atelectasis. There is no pleural effusion or pneumothorax. The central airways are patent. Upper Abdomen: Pneumoperitoneum, likely related to recent surgery. There is extension of air into the subcutaneous soft tissues of the  abdominal wall. Musculoskeletal: No acute osseous pathology.  Review of the MIP images confirms the above findings. IMPRESSION: 1. Nonocclusive right lower lobe pulmonary artery emboli. No CT evidence of right heart straining. 2. Pneumoperitoneum, likely related to recent surgery. These results were called by telephone at the time of interpretation on 11/03/2023 at 7:27 pm to provider [facility] , who verbally acknowledged these results. Electronically Signed   By: Elgie Collard M.D.   On: 11/03/2023 19:47    Scheduled Meds:  acetaminophen  650 mg Oral Q6H   docusate sodium  100 mg Oral BID   ferrous sulfate  325 mg Oral Q breakfast   Continuous Infusions:  heparin 1,400 Units/hr (11/04/23 0513)     LOS: 1 day    Time spent: 50 mins.   Willeen Niece, MD [facility]   If 7PM-7AM, please contact night-coverage

## 2023-11-04 NOTE — TOC Initial Note (Addendum)
Transition of Care [facility]) - Initial/Assessment Note    Patient Details  Name: Olivia Alvarez MRN: [id number] Date of Birth: September 19, 1981  Transition of Care [facility]) CM/SW Contact:    Adrian Prows, RN Phone Number: 11/04/2023, 1:12 PM  Clinical Narrative:                 TOC for d/c planning; no PCP/insurance listed; spoke w/ pt and husband Gerianne Vanessen in room; pt says she is from home; she plans to return at d/c; pt says she has transportation; she denies SDOH risks; pt confirms she does not have insurance or PCP; pt has glasses; she does not have DME, HH services, or home oxygen; she agrees to receive resources for [facility]; resources provided for [facility], and Social Services; pt requests this RN, CM assist in scheduling appt; she does not have a facility preference; spoke w/ Dominque, [employer] agent; appt scheduled at [facility] on Monday November 17, 2023 at 10 AM w/ Dr Georganna Skeans; appt info placed in D/C instructions; pt will call office if appt needs to be changed; pt will make her own appt w/ Social Services;  she verbalized understanding; TOC will follow for additional needs.  Expected Discharge Plan: Home/Self Care Barriers to Discharge: Continued Medical Work up   Patient Goals and CMS Choice Patient states their goals for this hospitalization and ongoing recovery are:: home CMS Medicare.gov Compare Post Acute Care list provided to:: Patient        Expected Discharge Plan and Services   Discharge Planning Services: CM Consult   Living arrangements for the past 2 months: Apartment                                      Prior Living Arrangements/Services Living arrangements for the past 2 months: Apartment Lives with:: Spouse Patient language and need for interpreter reviewed:: Yes Do you feel safe going back to the place where you live?: Yes      Need for Family Participation in Patient Care:  Yes (Comment) Care giver support system in place?: Yes (comment) Current home services:  (n/a) Criminal Activity/Legal Involvement Pertinent to Current Situation/Hospitalization: No - Comment as needed  Activities of Daily Living   ADL Screening (condition at time of admission) Independently performs ADLs?: Yes (appropriate for developmental age) Is the patient deaf or have difficulty hearing?: No Does the patient have difficulty seeing, even when wearing glasses/contacts?: No Does the patient have difficulty concentrating, remembering, or making decisions?: No  Permission Sought/Granted Permission sought to share information with : Case Manager Permission granted to share information with : Yes, Verbal Permission Granted  Share Information with NAME: Case Manager     Permission granted to share info w Relationship: Reannah Losano (spouse) [phone]     Emotional Assessment Appearance:: Appears stated age Attitude/Demeanor/Rapport: Gracious Affect (typically observed): Accepting Orientation: : Oriented to Self, Oriented to Place, Oriented to  Time, Oriented to Situation Alcohol / Substance Use: Not Applicable Psych Involvement: No (comment)  Admission diagnosis:  Pulmonary emboli (HCC) [I26.99] Anemia, unspecified type [D64.9] Multiple subsegmental pulmonary emboli without acute cor pulmonale (HCC) [I26.94] Patient Active Problem List   Diagnosis Date Noted   Pulmonary emboli (HCC) 11/03/2023   Anemia 11/03/2023   Uterine fibroid 10/04/2023   Abnormal uterine bleeding (AUB) 10/04/2023   Acute blood loss anemia 10/03/2023   Encounter  for female sterilization procedure 02/15/2016   Chronic pain disorder 02/22/2015   Opioid dependence with withdrawal (HCC) 02/22/2015   Anxiety and depression 02/20/2015   ADVERSE DRUG REACTION, SULFA 05/17/2010   PCP:  Patient, No Pcp Per Pharmacy:   [id number] - [place], [place] - [address] AT [address] OF [facility]  [address] [place] [address] Phone: [phone] Fax: [phone]  [facility] [address] [place] - [address] [place] [address] Phone: [phone] Fax: [phone]  [id number] - [place],  - [address] AT [facility] OF [address] [place] [address] Phone: [phone] Fax: [phone]     Social Determinants of Health (SDOH) Social History: SDOH Screenings   Food Insecurity: No Food Insecurity (11/04/2023)  Housing: Low Risk  (11/04/2023)  Transportation Needs: No Transportation Needs (11/04/2023)  Utilities: Not At Risk (11/04/2023)  Tobacco Use: Low Risk  (11/03/2023)   SDOH Interventions: Food Insecurity Interventions: Intervention Not Indicated, Inpatient TOC Housing Interventions: Intervention Not Indicated, Inpatient TOC Transportation Interventions: Intervention Not Indicated, Inpatient TOC Utilities Interventions: Intervention Not Indicated, Inpatient TOC   Readmission Risk Interventions     No data to display

## 2023-11-04 NOTE — Progress Notes (Signed)
PHARMACY - ANTICOAGULATION CONSULT NOTE  Pharmacy Consult for heparin Indication: acute pulmonary embolus  Allergies  Allergen Reactions   Sulfa Antibiotics Other (See Comments)    UNKNOWN   Patient Measurements: Height: 5\' 6"  (167.6 cm) Weight: 70.3 kg (155 lb) IBW/kg (Calculated) : 59.3 Heparin Dosing Weight: 70 kg  Vital Signs: Temp: 98.1 F (36.7 C) (11/19 0910) Temp Source: Oral (11/19 0910) BP: 115/74 (11/19 0910) Pulse Rate: 75 (11/19 0910)  Labs: Recent Labs    11/03/23 1639 11/03/23 2000 11/03/23 2247 11/04/23 0356 11/04/23 1134  HGB 8.1*  --   --  7.4*  --   HCT 28.3*  --   --  25.9*  --   PLT 295  --   --  267  --   HEPARINUNFRC  --   --   --  <0.10* 0.13*  CREATININE 0.60  --   --  0.68  --   TROPONINIHS  --  <2 <2  --   --     Estimated Creatinine Clearance: 87.5 mL/min (by C-G formula based on SCr of 0.68 mg/dL).   Medical History: Past Medical History:  Diagnosis Date   Anemia    Anxiety    no meds   Dental crown present    Depression    no meds   Distal radius fracture, right 12/2016   displaced/comminuted   History of MRSA infection    arm    Assessment: Patient is a 42 y.o F with hx abnormal uterine bleeding who underwent total hysterectomy with bilateral salpingectomy with excision of left vulvar lesion on 10/30/23, presented to the ED on 11/03/23 with c/o SOB and RLE swelling.  LE doppler on 11/03/23 was negative for DVT. Chest CT on 11/03/23 showed, "nonocclusive right lower lobe pulmonary artery emboli" with no evidence of RHS.  Today, 11/04/2023: Hgb low at 7.4, plts wnl Heparin level low at 0.13 after rate increase earlier today  Goal of Therapy:  Heparin level 0.3-0.7 units/ml Monitor platelets by anticoagulation protocol: Yes   Plan:  Increase heparin infusion to 1,600 units/hr Check 6 hour heparin level Monitor daily heparin level, CBC, s/s of bleed  Enzo Bi, PharmD, BCPS, BCIDP Clinical  Pharmacist 11/04/2023 12:50 PM

## 2023-11-04 NOTE — Progress Notes (Signed)
PHARMACY - ANTICOAGULATION CONSULT NOTE  Pharmacy Consult for heparin Indication: acute pulmonary embolus  Allergies  Allergen Reactions   Sulfa Antibiotics Other (See Comments)    UNKNOWN   Patient Measurements: Height: 5\' 6"  (167.6 cm) Weight: 70.3 kg (155 lb) IBW/kg (Calculated) : 59.3 Heparin Dosing Weight: 70 kg  Vital Signs: Temp: 98.5 F (36.9 C) (11/19 2016) Temp Source: Oral (11/19 2016) BP: 115/75 (11/19 2016) Pulse Rate: 86 (11/19 2016)  Labs: Recent Labs    11/03/23 1639 11/03/23 2000 11/03/23 2247 11/04/23 0356 11/04/23 1134 11/04/23 1911  HGB 8.1*  --   --  7.4*  --   --   HCT 28.3*  --   --  25.9*  --   --   PLT 295  --   --  267  --   --   HEPARINUNFRC  --   --   --  <0.10* 0.13* 0.26*  CREATININE 0.60  --   --  0.68  --   --   TROPONINIHS  --  <2 <2  --   --   --     Estimated Creatinine Clearance: 87.5 mL/min (by C-G formula based on SCr of 0.68 mg/dL).   Assessment: Patient is a 42 y.o F with hx abnormal uterine bleeding who underwent total hysterectomy with bilateral salpingectomy with excision of left vulvar lesion on 10/30/23, presented to the ED on 11/03/23 with c/o SOB and RLE swelling.  LE doppler on 11/03/23 was negative for DVT. Chest CT on 11/03/23 showed, "nonocclusive right lower lobe pulmonary artery emboli" with no evidence of RHS.  Today, 11/04/2023: Hgb low at 7.4, plts wnl Heparin level improved after rate increase, but remains slightly low on 1600 units/hr SCr stable < 1.0 (baseline) Per RN, no bleeding or infusion issues noted  Goal of Therapy:  Heparin level 0.3-0.7 units/ml Monitor platelets by anticoagulation protocol: Yes   Plan:  Increase heparin infusion to 1,850 units/hr; no bolus given low Hgb and nonocclusive PE Check 6 hour heparin level Monitor daily heparin level, CBC, s/s of bleed  Bernadene Person, PharmD, BCPS [phone] 11/04/2023, 8:41 PM

## 2023-11-04 NOTE — Plan of Care (Signed)
  Problem: Education: Goal: Knowledge of General Education information will improve Description: Including pain rating scale, medication(s)/side effects and non-pharmacologic comfort measures Outcome: Progressing   Problem: Activity: Goal: Risk for activity intolerance will decrease Outcome: Progressing   Problem: Coping: Goal: Level of anxiety will decrease Outcome: Progressing   Problem: Pain Management: Goal: General experience of comfort will improve Outcome: Progressing   Problem: Education: Goal: Knowledge of General Education information will improve Description: Including pain rating scale, medication(s)/side effects and non-pharmacologic comfort measures Outcome: Progressing   Problem: Activity: Goal: Risk for activity intolerance will decrease Outcome: Progressing   Problem: Coping: Goal: Level of anxiety will decrease Outcome: Progressing   Problem: Pain Management: Goal: General experience of comfort will improve Outcome: Progressing

## 2023-11-04 NOTE — Progress Notes (Signed)
PHARMACY - ANTICOAGULATION CONSULT NOTE  Pharmacy Consult for heparin Indication: acute pulmonary embolus  Allergies  Allergen Reactions   Sulfa Antibiotics Other (See Comments)    UNKNOWN    Patient Measurements: Height: 5\' 6"  (167.6 cm) Weight: 70.3 kg (155 lb) IBW/kg (Calculated) : 59.3 Heparin Dosing Weight: 70 kg  Vital Signs: Temp: 98.3 F (36.8 C) (11/19 0234) Temp Source: Oral (11/19 0234) BP: 107/65 (11/19 0234) Pulse Rate: 68 (11/19 0234)  Labs: Recent Labs    11/03/23 1639 11/03/23 2000 11/03/23 2247 11/04/23 0356  HGB 8.1*  --   --  7.4*  HCT 28.3*  --   --  25.9*  PLT 295  --   --  267  HEPARINUNFRC  --   --   --  <0.10*  CREATININE 0.60  --   --  0.68  TROPONINIHS  --  <2 <2  --     Estimated Creatinine Clearance: 87.5 mL/min (by C-G formula based on SCr of 0.68 mg/dL).   Medical History: Past Medical History:  Diagnosis Date   Anemia    Anxiety    no meds   Dental crown present    Depression    no meds   Distal radius fracture, right 12/2016   displaced/comminuted   History of MRSA infection    arm    Assessment: Patient is a 42 y.o F with hx abnormal uterine bleeding who underwent total hysterectomy with bilateral salpingectomy with excision of left vulvar lesion on 10/30/23, presented to the ED on 11/03/23 with c/o SOB and RLE swelling.  LE doppler on 11/03/23 was negative for DVT. Chest CT on 11/03/23 showed, "nonocclusive right lower lobe pulmonary artery emboli" with no evidence of RHS.  - per Dr. Particia Nearing via [place] msg, no bolus for heparin drip d/t recent surgical procedure.   Today, 11/04/2023: - Heparin level < 0.1 with heparin gtt @ 1200 units/hr - RN confirms no interruption in therapy; no line issues - RN reports no bleeding complications - Hgb 7.4 (low), PLTC  wnl  Goal of Therapy:  Heparin level 0.3-0.7 units/ml Monitor platelets by anticoagulation protocol: Yes   Plan:  - no heparin bolus per MD  - increase heparin  drip to 1400 units/hr - check 6 hr heparin level  - monitor for s/sx bleeding   Maverik Foot, Joselyn Glassman, PharmD 11/04/2023,5:07 AM

## 2023-11-04 NOTE — Progress Notes (Signed)
Gyn Progress Note  S: I have not been consulted by the inpatient IM team, but performed surgery on the patient. I have been closely following her post-op course including two post-operative office visits, one of which was yesterday, and I recommended she present to the ED for a possible DVT/PE. I came to offer my support to the patient and to see if I could add anything to the primary team's care.  Olivia Alvarez appears in better spirits today. She is still complaining of intermittent sharp pains, especially around her right upper quadrant. She is eating and drinking well. She has passed gas and had a bowel movement. She has been able to ambulated to the bathroom, however further ambulation is limited by pain. I strongly recommended trying to spend time OOB and working on decreasing amount of pain medications if possible today. Voiding well. Denies any vaginal bleeding.  I discussed with Olivia Alvarez that I am concerned that she may have an underlying clotting disorder, considering the development of a PE in spite of minimally invasive surgery and pre-op heparin. I also discussed with her that the pathology from her surgical specimens all came back benign.   O: Today's Vitals   11/04/23 1035 11/04/23 1045 11/04/23 1302 11/04/23 1336  BP:   112/69   Pulse:   87   Resp:   18   Temp:   99.1 F (37.3 C)   TempSrc:   Oral   SpO2:   96%   Weight:      Height:      PainSc: 6  4   6     Body mass index is 25.02 kg/m. Gen: well appearing Resp: no increased WOB on room air Card: well perfused Abd: mildly distended and appropriately tender to palpation c/w post-operative state, no rebound or guarding. Incision sites c/d/i Ext: bilateral edema L>R, but improved from yesterday  A/P: Olivia Alvarez is a 42 year old female, POD#5 from an uncomplicated robotic assisted total laparoscopic hysterectomy, bilateral salpingectomy, cystoscopy, and left labial lesion excision, now hospitalized with a right sided PE.  Plan per  primary inpatient team. I am grateful for your care for Olivia Alvarez. She is scheduled for a post-operative visit with me on 11/20/2023, but she will contact me if she has any further issues prior to that.  Ambrose Mantle, MD

## 2023-11-04 NOTE — Evaluation (Signed)
Physical Therapy Evaluation-1x Patient Details Name: Olivia Alvarez MRN: [id number] DOB: Dec 08, 1981 Today's Date: 11/04/2023  History of Present Illness  42 yo female admitted with acute PE. Hx of opioid abuse, MRSA, anemia, ORIF R wrist 2018, s/p hysterectomy/bil salpingectomy, L labial excision 10/30/23  Clinical Impression  On eval, pt was Mod Ind with mobility. She was irritable and not pleased about having to mobilize. With time, she was agreeable. She walked ~300 feet while managing IV pole. O2 96% on RA, HR 105 bpm. Pt reported she walked earlier today as well. No acute PT needs. 1x eval. Will sign off.  Mobility team, nursing, or family member can ambulate with pt.       If plan is discharge home, recommend the following:     Can travel by private vehicle        Equipment Recommendations None recommended by PT  Recommendations for Other Services       Functional Status Assessment Patient has had a recent decline in their functional status and demonstrates the ability to make significant improvements in function in a reasonable and predictable amount of time.     Precautions / Restrictions Restrictions Weight Bearing Restrictions: No      Mobility  Bed Mobility Overal bed mobility: Modified Independent                  Transfers Overall transfer level: Modified independent                      Ambulation/Gait Ambulation/Gait assistance: Modified independent (Device/Increase time) Gait Distance (Feet): 300 Feet Assistive device: IV Pole Gait Pattern/deviations: Step-through pattern, Decreased stride length       General Gait Details: Slow gait speed. No LOB. Pt managing IV pole-stated she didn't feel reliant on it. O2 96% on RA, HR 105 bpm. Pt denied dyspnea. Increased pain as distance increased.  Stairs            Wheelchair Mobility     Tilt Bed    Modified Rankin (Stroke Patients Only)       Balance Overall balance  assessment: Mild deficits observed, not formally tested                                           Pertinent Vitals/Pain Pain Assessment Pain Assessment: Faces Faces Pain Scale: Hurts whole lot Pain Location: abdomen Pain Descriptors / Indicators: Grimacing, Guarding Pain Intervention(s): Limited activity within patient's tolerance, Monitored during session, Repositioned    Home Living Family/patient expects to be discharged to:: Private residence Living Arrangements: Spouse/significant other   Type of Home: House         Home Layout: One level Home Equipment: None      Prior Function Prior Level of Function : Independent/Modified Independent                     Extremity/Trunk Assessment   Upper Extremity Assessment Upper Extremity Assessment: Overall WFL for tasks assessed    Lower Extremity Assessment Lower Extremity Assessment: Overall WFL for tasks assessed    Cervical / Trunk Assessment Cervical / Trunk Assessment: Normal  Communication   Communication Communication: No apparent difficulties  Cognition Arousal: Alert Behavior During Therapy: WFL for tasks assessed/performed Overall Cognitive Status: Within Functional Limits for tasks assessed  General Comments      Exercises     Assessment/Plan    PT Assessment Patient does not need any further PT services  PT Problem List         PT Treatment Interventions      PT Goals (Current goals can be found in the Care Plan section)  Acute Rehab PT Goals Patient Stated Goal: none stated PT Goal Formulation: All assessment and education complete, DC therapy    Frequency       Co-evaluation               AM-PAC PT "6 Clicks" Mobility  Outcome Measure Help needed turning from your back to your side while in a flat bed without using bedrails?: None Help needed moving from lying on your back to sitting on the  side of a flat bed without using bedrails?: None Help needed moving to and from a bed to a chair (including a wheelchair)?: None Help needed standing up from a chair using your arms (e.g., wheelchair or bedside chair)?: None Help needed to walk in hospital room?: None Help needed climbing 3-5 steps with a railing? : A Little 6 Click Score: 23    End of Session   Activity Tolerance: Patient limited by pain Patient left: in bed;with call bell/phone within reach        Time: 1600-1615 PT Time Calculation (min) (ACUTE ONLY): 15 min   Charges:   PT Evaluation $PT Eval Low Complexity: 1 Low   PT General Charges $$ ACUTE PT VISIT: 1 Visit            Faye Ramsay, PT Acute Rehabilitation  Office: [phone]

## 2023-11-05 ENCOUNTER — Other Ambulatory Visit (HOSPITAL_COMMUNITY): Payer: Self-pay

## 2023-11-05 LAB — BASIC METABOLIC PANEL
Anion gap: 8 (ref 5–15)
BUN: 12 mg/dL (ref 6–20)
CO2: 24 mmol/L (ref 22–32)
Calcium: 8.4 mg/dL — ABNORMAL LOW (ref 8.9–10.3)
Chloride: 106 mmol/L (ref 98–111)
Creatinine, Ser: 0.53 mg/dL (ref 0.44–1.00)
GFR, Estimated: >60 mL/min (ref >60)
Glucose, Bld: 101 mg/dL — ABNORMAL HIGH (ref 70–99)
Potassium: 3.9 mmol/L (ref 3.5–5.1)
Sodium: 138 mmol/L (ref 135–145)

## 2023-11-05 LAB — CBC
HCT: 27.6 % — ABNORMAL LOW (ref 36.0–46.0)
Hemoglobin: 7.6 g/dL — ABNORMAL LOW (ref 12.0–15.0)
MCH: 22 pg — ABNORMAL LOW (ref 26.0–34.0)
MCHC: 27.5 g/dL — ABNORMAL LOW (ref 30.0–36.0)
MCV: 79.8 fL — ABNORMAL LOW (ref 80.0–100.0)
Platelets: 268 10*3/uL (ref 150–400)
RBC: 3.46 MIL/uL — ABNORMAL LOW (ref 3.87–5.11)
RDW: 23.5 % — ABNORMAL HIGH (ref 11.5–15.5)
WBC: 3.8 10*3/uL — ABNORMAL LOW (ref 4.0–10.5)
nRBC: 0 % (ref 0.0–0.2)

## 2023-11-05 LAB — HEPARIN LEVEL (UNFRACTIONATED)
Heparin Unfractionated: 0.4 [IU]/mL (ref 0.30–0.70)
Heparin Unfractionated: 0.58 [IU]/mL (ref 0.30–0.70)

## 2023-11-05 LAB — PHOSPHORUS: Phosphorus: 3.9 mg/dL (ref 2.5–4.6)

## 2023-11-05 LAB — MAGNESIUM: Magnesium: 1.8 mg/dL (ref 1.7–2.4)

## 2023-11-05 MED ORDER — APIXABAN 5 MG PO TABS
5.0000 mg | ORAL_TABLET | Freq: Two times a day (BID) | ORAL | 2 refills | Status: AC
  Filled 2023-11-05: qty 60, 30d supply, fill #0

## 2023-11-05 MED ORDER — APIXABAN (ELIQUIS) VTE STARTER PACK (10MG AND 5MG)
ORAL_TABLET | ORAL | 0 refills | Status: AC
  Filled 2023-11-05: qty 74, 30d supply, fill #0

## 2023-11-05 MED ORDER — APIXABAN 5 MG PO TABS: 5.0000 mg | ORAL_TABLET | Freq: Two times a day (BID) | ORAL | Status: DC

## 2023-11-05 MED ORDER — APIXABAN 5 MG PO TABS
10.0000 mg | ORAL_TABLET | Freq: Two times a day (BID) | ORAL | Status: DC
  Administered 2023-11-05: 10 mg via ORAL
  Filled 2023-11-05: qty 2

## 2023-11-05 MED ORDER — IBUPROFEN 200 MG PO TABS: 600.0000 mg | ORAL_TABLET | Freq: Four times a day (QID) | ORAL | Status: DC

## 2023-11-05 MED ORDER — APIXABAN 5 MG PO TABS: 10.0000 mg | ORAL_TABLET | Freq: Two times a day (BID) | ORAL | Status: DC

## 2023-11-05 MED ORDER — ACETAMINOPHEN 325 MG PO TABS: 650.0000 mg | ORAL_TABLET | Freq: Four times a day (QID) | ORAL | Status: DC

## 2023-11-05 NOTE — Plan of Care (Signed)
  Problem: Clinical Measurements: Goal: Diagnostic test results will improve Outcome: Progressing   Problem: Coping: Goal: Level of anxiety will decrease Outcome: Progressing   Problem: Safety: Goal: Ability to remain free from injury will improve Outcome: Progressing   Problem: Pain Management: Goal: General experience of comfort will improve Outcome: Not Progressing

## 2023-11-05 NOTE — Progress Notes (Signed)
PHARMACY - ANTICOAGULATION CONSULT NOTE  Pharmacy Consult for Heparin Indication: pulmonary embolus  Allergies  Allergen Reactions   Sulfa Antibiotics Other (See Comments)    UNKNOWN    Patient Measurements: Height: 5\' 6"  (167.6 cm) Weight: 70.3 kg (155 lb) IBW/kg (Calculated) : 59.3 Heparin Dosing Weight:    Vital Signs: Temp: 99.2 F (37.3 C) (11/20 1100) Temp Source: Oral (11/20 1100) BP: 115/72 (11/20 1100) Pulse Rate: 89 (11/20 1100)  Labs: Recent Labs    11/03/23 1639 11/03/23 2000 11/03/23 2247 11/04/23 0356 11/04/23 1134 11/04/23 1911 11/05/23 0406 11/05/23 1138  HGB 8.1*  --   --  7.4*  --   --  7.6*  --   HCT 28.3*  --   --  25.9*  --   --  27.6*  --   PLT 295  --   --  267  --   --  268  --   HEPARINUNFRC  --   --   --  <0.10*   < > 0.26* 0.40 0.58  CREATININE 0.60  --   --  0.68  --   --  0.53  --   TROPONINIHS  --  <2 <2  --   --   --   --   --    < > = values in this interval not displayed.    Estimated Creatinine Clearance: 87.5 mL/min (by C-G formula based on SCr of 0.53 mg/dL).   Medical History: Past Medical History:  Diagnosis Date   Anemia    Anxiety    no meds   Dental crown present    Depression    no meds   Distal radius fracture, right 12/2016   displaced/comminuted   History of MRSA infection    arm    Assessment:  AC/Heme: heparin per Rx for acute PE  - 11/14: total hysterectomy with bilateral salpingectomy with excision of left vulvar lesion for urterine bleeding  - 11/18 LE doppler: neg for DVT - 11/18 chest CT: nonocclusive right lower lobe pulmonary artery emboli" with no evidence of RHS.   Today,11/05/23 HL 0.58, therapeutic Hgb 7.6 low but stable Plt 268  No line or bleeding issues per RN   Goal of Therapy:  Heparin level 0.3-0.7 units/ml Monitor platelets by anticoagulation protocol: Yes   Plan:  Con't IV heparin 1850 units/hr,  Check daily HL and CBC  Monitor for signs and symptoms of bleeding F/u  transition to PO anticoagulation    Adalberto Cole, PharmD, BCPS 11/05/2023 1:56 PM

## 2023-11-05 NOTE — Progress Notes (Signed)
PHARMACY - ANTICOAGULATION CONSULT NOTE  Pharmacy Consult for Heparin >> Eliquis Indication: pulmonary embolus  Allergies  Allergen Reactions   Sulfa Antibiotics Other (See Comments)    UNKNOWN    Patient Measurements: Height: 5\' 6"  (167.6 cm) Weight: 70.3 kg (155 lb) IBW/kg (Calculated) : 59.3 Heparin Dosing Weight:    Vital Signs: Temp: 99.2 F (37.3 C) (11/20 1100) Temp Source: Oral (11/20 1100) BP: 115/72 (11/20 1100) Pulse Rate: 89 (11/20 1100)  Labs: Recent Labs    11/03/23 1639 11/03/23 2000 11/03/23 2247 11/04/23 0356 11/04/23 1134 11/04/23 1911 11/05/23 0406 11/05/23 1138  HGB 8.1*  --   --  7.4*  --   --  7.6*  --   HCT 28.3*  --   --  25.9*  --   --  27.6*  --   PLT 295  --   --  267  --   --  268  --   HEPARINUNFRC  --   --   --  <0.10*   < > 0.26* 0.40 0.58  CREATININE 0.60  --   --  0.68  --   --  0.53  --   TROPONINIHS  --  <2 <2  --   --   --   --   --    < > = values in this interval not displayed.    Estimated Creatinine Clearance: 87.5 mL/min (by C-G formula based on SCr of 0.53 mg/dL).   Medical History: Past Medical History:  Diagnosis Date   Anemia    Anxiety    no meds   Dental crown present    Depression    no meds   Distal radius fracture, right 12/2016   displaced/comminuted   History of MRSA infection    arm    Assessment:  AC/Heme: heparin per Rx for acute PE  - 11/14: total hysterectomy with bilateral salpingectomy with excision of left vulvar lesion for urterine bleeding  - 11/18 LE doppler: neg for DVT - 11/18 chest CT: nonocclusive right lower lobe pulmonary artery emboli" with no evidence of RHS. - 11/20: transition from heparin >> Eliquis   Today,11/05/23 HL 0.58, therapeutic Hgb 7.6, low but stable Plt 268, stable No line or bleeding issues per RN Noted that patient does not have insurance  Goal of Therapy:  Heparin level 0.3-0.7 units/ml Monitor platelets by anticoagulation protocol: Yes   Plan:   Discontinue heparin Start Eliquis 10mg  BID x7 days, followed by 5mg  BID thereafter Monitor for signs and symptoms of bleeding Provide patient with Eliquis 30-day free card during Eliquis education     Cherylin Mylar, PharmD Clinical Pharmacist  11/20/20242:43 PM

## 2023-11-05 NOTE — Hospital Course (Signed)
Olivia Alvarez is a 42 yo female with PMH AUB s/p robotic assisted total hysterectomy with B/L salpingectomy on 10/30/23.  She presented with SOB and leg swelling. She was admitted for PE/DVT workup.  Bilateral lower extremity duplex was negative for DVT. CT angio chest showed right lower lobe PE.  Small pneumoperitoneum seen considered due to recent surgery. She was started on a heparin drip initially.  She was transitioned to Eliquis at discharge with plans for first month prescription card and patient to apply on manufacturer website for ongoing Eliquis assistance.  She understands if she were to be declined from prescription assistance, alternative plan would likely need to be transitioning to Coumadin prior to completion of the first month of Eliquis. She is also arranged for outpatient follow-up with primary care.  Assessment & Plan:   Acute right PE - she does endorse about 3 days post op of extreme minimal activity at home due to pain - noted FH of malignancy and possible clot but also suspect could have been provoked in setting of those malignancies - could consider referral to hematology to discuss if hypercoag workup warranted but for now etiology moreso considered from poor mobility - LE duplex negative for DVT - s/p heparin drip; transition to Eliquis - 1st mth Rx provided free; patient knows to apply on [employer] website for aid and if does not get approved, would need Coumadin for the remainder of course - she has follow up with [facility] on 12/2   Microcytic hypochromic anemia: Likely secondary to recent dysfunctional uterine bleeding. Continue iron supplementation.   History of opioid abus Patient has been requiring high-dose IV narcotics. - further outpt pain control deferred to surgery   Pneumoperitoneum: Pneumoperitoneum likely related to recent surgery. Case discussed on admission with general surgery, states this is postoperative, Continue  supportive care.

## 2023-11-05 NOTE — Plan of Care (Signed)
  Problem: Education: Goal: Knowledge of General Education information will improve Description: Including pain rating scale, medication(s)/side effects and non-pharmacologic comfort measures Outcome: Progressing   Problem: Clinical Measurements: Goal: Ability to maintain clinical measurements within normal limits will improve Outcome: Progressing   Problem: Activity: Goal: Risk for activity intolerance will decrease Outcome: Progressing   Problem: Nutrition: Goal: Adequate nutrition will be maintained Outcome: Progressing   Problem: Coping: Goal: Level of anxiety will decrease Outcome: Progressing   Problem: Pain Management: Goal: General experience of comfort will improve Outcome: Progressing

## 2023-11-05 NOTE — Progress Notes (Signed)
PHARMACY - ANTICOAGULATION CONSULT NOTE  Pharmacy Consult for Heparin Indication: pulmonary embolus  Allergies  Allergen Reactions   Sulfa Antibiotics Other (See Comments)    UNKNOWN    Patient Measurements: Height: 5\' 6"  (167.6 cm) Weight: 70.3 kg (155 lb) IBW/kg (Calculated) : 59.3 Heparin Dosing Weight:    Vital Signs: Temp: 98.5 F (36.9 C) (11/19 2016) Temp Source: Oral (11/19 2016) BP: 115/75 (11/19 2016) Pulse Rate: 86 (11/19 2016)  Labs: Recent Labs    11/03/23 1639 11/03/23 1639 11/03/23 2000 11/03/23 2247 11/04/23 0356 11/04/23 1134 11/04/23 1911 11/05/23 0406  HGB 8.1*  --   --   --  7.4*  --   --  7.6*  HCT 28.3*  --   --   --  25.9*  --   --  27.6*  PLT 295  --   --   --  267  --   --  268  HEPARINUNFRC  --    < >  --   --  <0.10* 0.13* 0.26* 0.40  CREATININE 0.60  --   --   --  0.68  --   --   --   TROPONINIHS  --   --  <2 <2  --   --   --   --    < > = values in this interval not displayed.    Estimated Creatinine Clearance: 87.5 mL/min (by C-G formula based on SCr of 0.68 mg/dL).   Medical History: Past Medical History:  Diagnosis Date   Anemia    Anxiety    no meds   Dental crown present    Depression    no meds   Distal radius fracture, right 12/2016   displaced/comminuted   History of MRSA infection    arm    Assessment:  AC/Heme: heparin per Rx for acute PE  - 11/14: total hysterectomy with bilateral salpingectomy with excision of left vulvar lesion for urterine bleeding  - 11/18 LE doppler: neg for DVT - 11/18 chest CT: nonocclusive right lower lobe pulmonary artery emboli" with no evidence of RHS. - 11/20: Hep level 0.4 in goal. Hgb 7.6 stable.Recheck in 6 hrs to confirm therapeutic level.  Goal of Therapy:  Heparin level 0.3-0.7 units/ml Monitor platelets by anticoagulation protocol: Yes   Plan:  - Con't IV heparin 1850 units/hr, Recheck in 6 hrs to confirm therapeutic level.   Harshith Pursell S. Merilynn Finland, PharmD,  BCPS Clinical Staff Pharmacist [email or web address]  Merilynn Finland, [employer] 11/05/2023,5:18 AM

## 2023-11-05 NOTE — Discharge Summary (Signed)
Physician Discharge Summary   Olivia Alvarez ZOX:[id number] DOB: [birth year] DOA: 11/03/2023  PCP: Patient, No Pcp Per  Admit date: 11/03/2023 Discharge date: 11/05/2023   Admitted From: Home Disposition:  Home Discharging physician: Lewie Chamber, MD Barriers to discharge: none  Recommendations at discharge: Follow/establish with primary care Confirm patient has completed Eliquis application from manufacturer website; would need Coumadin to finish 3 mth course if not approved Check Hgb   Discharge Condition: stable CODE STATUS: Full Diet recommendation:  Diet Orders (From admission, onward)     Start     Ordered   11/05/23 0000  Diet general        11/05/23 1447   11/03/23 2040  Diet regular Room service appropriate? Yes; Fluid consistency: Thin  Diet effective now       Question Answer Comment  Room service appropriate? Yes   Fluid consistency: Thin      11/03/23 2041            Hospital Course: Olivia Alvarez is a 42 yo female with PMH AUB s/p robotic assisted total hysterectomy with B/L salpingectomy on 10/30/23.  She presented with SOB and leg swelling. She was admitted for PE/DVT workup.  Bilateral lower extremity duplex was negative for DVT. CT angio chest showed right lower lobe PE.  Small pneumoperitoneum seen considered due to recent surgery. She was started on a heparin drip initially.  She was transitioned to Eliquis at discharge with plans for first month prescription card and patient to apply on manufacturer website for ongoing Eliquis assistance.  She understands if she were to be declined from prescription assistance, alternative plan would likely need to be transitioning to Coumadin prior to completion of the first month of Eliquis. She is also arranged for outpatient follow-up with primary care.  Assessment & Plan:   Acute right PE - she does endorse about 3 days post op of extreme minimal activity at home due to pain - noted FH of malignancy  and possible clot but also suspect could have been provoked in setting of those malignancies - could consider referral to hematology to discuss if hypercoag workup warranted but for now etiology moreso considered from poor mobility - LE duplex negative for DVT - s/p heparin drip; transition to Eliquis - 1st mth Rx provided free; patient knows to apply on [employer] website for aid and if does not get approved, would need Coumadin for the remainder of course - she has follow up with Primary Care [facility] on 12/2   Microcytic hypochromic anemia: Likely secondary to recent dysfunctional uterine bleeding. Continue iron supplementation.   History of opioid abus Patient has been requiring high-dose IV narcotics. - further outpt pain control deferred to surgery   Pneumoperitoneum: Pneumoperitoneum likely related to recent surgery. Case discussed on admission with general surgery, states this is postoperative, Continue supportive care.   The patient's acute and chronic medical conditions were treated accordingly. On day of discharge, patient was felt deemed stable for discharge. Patient/family member advised to call PCP or come back to ER if needed.   Principal Diagnosis: Pulmonary emboli [facility])  Discharge Diagnoses: Active Hospital Problems   Diagnosis Date Noted   Pulmonary emboli (HCC) 11/03/2023   Anemia 11/03/2023    Resolved Hospital Problems  No resolved problems to display.     Discharge Instructions     Diet general   Complete by: As directed    Increase activity slowly   Complete by: As directed  Allergies as of 11/05/2023       Reactions   Sulfa Antibiotics Other (See Comments)   UNKNOWN        Medication List     STOP taking these medications    SPRINTEC 28 PO       TAKE these medications    acetaminophen 500 MG tablet Commonly known as: TYLENOL Take 2 tablets (1,000 mg total) by mouth every 6 (six) hours as needed.    cyclobenzaprine 5 MG tablet Commonly known as: FLEXERIL Take 5 mg by mouth 3 (three) times daily as needed.   Eliquis DVT/PE Starter Pack Generic drug: Apixaban Starter Pack (10mg  and 5mg ) Take as directed on package: start with two-5mg  tablets twice daily for 7 days. On day 8, switch to one-5mg  tablet twice daily.   apixaban 5 MG Tabs tablet Commonly known as: ELIQUIS Take 1 tablet (5 mg total) by mouth 2 (two) times daily. Start taking on: December 04, 2023   ferrous sulfate 325 (65 FE) MG tablet Take 1 tablet (325 mg total) by mouth 2 (two) times daily before a meal. What changed: when to take this   IBUPROFEN PO Take 2 tablets by mouth as needed (pain).   oxycodone 5 MG capsule Commonly known as: OXY-IR Take 5 mg by mouth every 4 (four) hours as needed for pain.   VITAMIN C PO Take 1 tablet by mouth daily.        Follow-up Information     [facility] Go in 12 day(s).   Specialty: Family Medicine Why: Dr Georganna Skeans Monday November 17, 2023 at [facility] information: [address], [address] [place] [address] [phone]               Allergies  Allergen Reactions   Sulfa Antibiotics Other (See Comments)    UNKNOWN    Consultations:   Procedures:   Discharge Exam: BP 115/72 (BP Location: Left Arm)   Pulse 89   Temp 99.2 F (37.3 C) (Oral)   Resp 19   Ht 5\' 6"  (1.676 m)   Wt 70.3 kg   LMP 10/03/2023 (Approximate)   SpO2 100%   BMI 25.02 kg/m  Physical Exam Constitutional:      Appearance: Normal appearance.  HENT:     Head: Normocephalic and atraumatic.     Mouth/Throat:     Mouth: Mucous membranes are moist.  Eyes:     Extraocular Movements: Extraocular movements intact.  Cardiovascular:     Rate and Rhythm: Normal rate and regular rhythm.  Pulmonary:     Effort: Pulmonary effort is normal. No respiratory distress.     Breath sounds: Normal breath sounds. No wheezing.   Abdominal:     General: Bowel sounds are normal. There is no distension.     Palpations: Abdomen is soft.     Tenderness: There is no abdominal tenderness.  Musculoskeletal:        General: Normal range of motion.     Cervical back: Normal range of motion and neck supple.  Skin:    General: Skin is warm and dry.  Neurological:     General: No focal deficit present.     Mental Status: She is alert.  Psychiatric:        Mood and Affect: Mood normal.      The results of significant diagnostics from this hospitalization (including imaging, microbiology, ancillary and laboratory) are listed below for reference.   Microbiology:  Recent Results (from the past 240 hour(s))  Surgical pcr screen     Status: None   Collection Time: 10/27/23 11:06 AM   Specimen: Nasal Mucosa; Nasal Swab  Result Value Ref Range Status   MRSA, PCR NEGATIVE NEGATIVE Final   Staphylococcus aureus NEGATIVE NEGATIVE Final    Comment: (NOTE) The Xpert SA Assay (FDA approved for NASAL specimens in patients 28 years of age and older), is one component of a comprehensive surveillance program. It is not intended to diagnose infection nor to guide or monitor treatment. Performed at [facility], [address], [place], [place] [address]      Labs: BNP (last 3 results) Recent Labs    11/03/23 1639  BNP 97.8   Basic Metabolic Panel: Recent Labs  Lab 11/03/23 1639 11/04/23 0356 11/05/23 0406  NA 137 138 138  K 3.5 3.8 3.9  CL 104 104 106  CO2 26 27 24   GLUCOSE 115* 101* 101*  BUN 9 9 12   CREATININE 0.60 0.68 0.53  CALCIUM 8.6* 8.7* 8.4*  MG  --  1.8 1.8  PHOS  --   --  3.9   Liver Function Tests: No results for input(s): "AST", "ALT", "ALKPHOS", "BILITOT", "PROT", "ALBUMIN" in the last 168 hours. No results for input(s): "LIPASE", "AMYLASE" in the last 168 hours. No results for input(s): "AMMONIA" in the last 168 hours. CBC: Recent Labs  Lab 11/03/23 1639 11/04/23 0356  11/05/23 0406  WBC 4.7 4.0 3.8*  NEUTROABS 2.6  --   --   HGB 8.1* 7.4* 7.6*  HCT 28.3* 25.9* 27.6*  MCV 78.4* 78.2* 79.8*  PLT 295 267 268   Cardiac Enzymes: No results for input(s): "CKTOTAL", "CKMB", "CKMBINDEX", "TROPONINI" in the last 168 hours. BNP: Invalid input(s): "POCBNP" CBG: No results for input(s): "GLUCAP" in the last 168 hours. D-Dimer No results for input(s): "DDIMER" in the last 72 hours. Hgb A1c No results for input(s): "HGBA1C" in the last 72 hours. Lipid Profile No results for input(s): "CHOL", "HDL", "LDLCALC", "TRIG", "CHOLHDL", "LDLDIRECT" in the last 72 hours. Thyroid function studies No results for input(s): "TSH", "T4TOTAL", "T3FREE", "THYROIDAB" in the last 72 hours.  Invalid input(s): "FREET3" Anemia work up No results for input(s): "VITAMINB12", "FOLATE", "FERRITIN", "TIBC", "IRON", "RETICCTPCT" in the last 72 hours. Urinalysis    Component Value Date/Time   COLORURINE YELLOW 10/03/2023 1115   APPEARANCEUR CLEAR 10/03/2023 1115   LABSPEC 1.019 10/03/2023 1115   PHURINE 5.0 10/03/2023 1115   GLUCOSEU NEGATIVE 10/03/2023 1115   HGBUR MODERATE (A) 10/03/2023 1115   BILIRUBINUR NEGATIVE 10/03/2023 1115   KETONESUR NEGATIVE 10/03/2023 1115   PROTEINUR NEGATIVE 10/03/2023 1115   UROBILINOGEN 1.0 08/09/2011 2210   NITRITE NEGATIVE 10/03/2023 1115   LEUKOCYTESUR NEGATIVE 10/03/2023 1115   Sepsis Labs Recent Labs  Lab 11/03/23 1639 11/04/23 0356 11/05/23 0406  WBC 4.7 4.0 3.8*   Microbiology Recent Results (from the past 240 hour(s))  Surgical pcr screen     Status: None   Collection Time: 10/27/23 11:06 AM   Specimen: Nasal Mucosa; Nasal Swab  Result Value Ref Range Status   MRSA, PCR NEGATIVE NEGATIVE Final   Staphylococcus aureus NEGATIVE NEGATIVE Final    Comment: (NOTE) The Xpert SA Assay (FDA approved for NASAL specimens in patients 57 years of age and older), is one component of a comprehensive surveillance program. It is not  intended to diagnose infection nor to guide or monitor treatment. Performed at [facility], [address] [place], [place] [address]     Procedures/Studies: VAS [place] LOWER EXTREMITY VENOUS (DVT) (ONLY [facility] & [facility])  Result Date: 11/04/2023  Lower Venous DVT Study Patient Name:  WANDER GENSON  Date of Exam:   11/03/2023 Medical Rec #: [id number]             Accession #:    [id number] Date of Birth: 1981/10/02             Patient Gender: F Patient Age:   12 years Exam Location:  [facility] Procedure:      VAS [place] LOWER EXTREMITY VENOUS (DVT) Referring Phys: JULIE HAVILAND --------------------------------------------------------------------------------  Indications: Swelling.  Risk Factors: Hysterectomy 10/30/2023. Comparison Study: Previous exam on 10/03/2023 was negative for DVT Performing Technologist: Ernestene Mention RVT, RDMS  Examination Guidelines: A complete evaluation includes B-mode imaging, spectral Doppler, color Doppler, and power Doppler as needed of all accessible portions of each vessel. Bilateral testing is considered an integral part of a complete examination. Limited examinations for reoccurring indications may be performed as noted. The reflux portion of the exam is performed with the patient in reverse Trendelenburg.  +---------+---------------+---------+-----------+----------+--------------+ RIGHT    CompressibilityPhasicitySpontaneityPropertiesThrombus Aging +---------+---------------+---------+-----------+----------+--------------+ CFV      Full           Yes      Yes                                 +---------+---------------+---------+-----------+----------+--------------+ SFJ      Full                                                        +---------+---------------+---------+-----------+----------+--------------+ FV Prox  Full           Yes      Yes                                  +---------+---------------+---------+-----------+----------+--------------+ FV Mid   Full           Yes      Yes                                 +---------+---------------+---------+-----------+----------+--------------+ FV DistalFull           Yes      Yes                                 +---------+---------------+---------+-----------+----------+--------------+ PFV      Full                                                        +---------+---------------+---------+-----------+----------+--------------+ POP      Full           Yes      Yes                                 +---------+---------------+---------+-----------+----------+--------------+  PTV      Full                                                        +---------+---------------+---------+-----------+----------+--------------+ PERO     Full                                                        +---------+---------------+---------+-----------+----------+--------------+   +---------+---------------+---------+-----------+----------+--------------+ LEFT     CompressibilityPhasicitySpontaneityPropertiesThrombus Aging +---------+---------------+---------+-----------+----------+--------------+ CFV      Full           Yes      Yes                                 +---------+---------------+---------+-----------+----------+--------------+ SFJ      Full                                                        +---------+---------------+---------+-----------+----------+--------------+ FV Prox  Full           Yes      Yes                                 +---------+---------------+---------+-----------+----------+--------------+ FV Mid   Full           Yes      Yes                                 +---------+---------------+---------+-----------+----------+--------------+ FV DistalFull           Yes      Yes                                  +---------+---------------+---------+-----------+----------+--------------+ PFV      Full                                                        +---------+---------------+---------+-----------+----------+--------------+ POP      Full           Yes      Yes                                 +---------+---------------+---------+-----------+----------+--------------+ PTV      Full                                                        +---------+---------------+---------+-----------+----------+--------------+  PERO     Full                                                        +---------+---------------+---------+-----------+----------+--------------+     Summary: BILATERAL: - No evidence of deep vein thrombosis seen in the lower extremities, bilaterally. -No evidence of popliteal cyst, bilaterally.   *See table(s) above for measurements and observations. Electronically signed by Gerarda Fraction on 11/04/2023 at 11:40:21 AM.    Final    CT Angio Chest PE W and/or Wo Contrast  Result Date: 11/03/2023 CLINICAL DATA:  Concern for pulmonary disease.  Recent hysterectomy. EXAM: CT ANGIOGRAPHY CHEST WITH CONTRAST TECHNIQUE: Multidetector CT imaging of the chest was performed using the standard protocol during bolus administration of intravenous contrast. Multiplanar CT image reconstructions and MIPs were obtained to evaluate the vascular anatomy. RADIATION DOSE REDUCTION: This exam was performed according to the departmental dose-optimization program which includes automated exposure control, adjustment of the mA and/or kV according to patient size and/or use of iterative reconstruction technique. CONTRAST:  80mL OMNIPAQUE IOHEXOL 350 MG/ML SOLN COMPARISON:  CT abdomen pelvis dated 10/03/2023 and chest radiograph dated 10/03/2023. FINDINGS: Cardiovascular: There is no cardiomegaly or pericardial effusion. The thoracic aorta is unremarkable. The origins of the great vessels of the aortic arch  appear patent. Nonocclusive right lower lobe pulmonary artery emboli involving the segmental and subsegmental branches. No CT evidence of right heart straining. Mediastinum/Nodes: No hilar or mediastinal adenopathy. The esophagus is grossly unremarkable. No mediastinal fluid collection. Lungs/Pleura: Bibasilar streaky atelectasis. There is no pleural effusion or pneumothorax. The central airways are patent. Upper Abdomen: Pneumoperitoneum, likely related to recent surgery. There is extension of air into the subcutaneous soft tissues of the abdominal wall. Musculoskeletal: No acute osseous pathology. Review of the MIP images confirms the above findings. IMPRESSION: 1. Nonocclusive right lower lobe pulmonary artery emboli. No CT evidence of right heart straining. 2. Pneumoperitoneum, likely related to recent surgery. These results were called by telephone at the time of interpretation on 11/03/2023 at 7:27 pm to provider [facility] , who verbally acknowledged these results. Electronically Signed   By: Elgie Collard M.D.   On: 11/03/2023 19:47     Time coordinating discharge: Over 30 minutes    Lewie Chamber, MD  [facility] 11/05/2023, 5:31 PM

## 2023-11-05 NOTE — Progress Notes (Signed)
AVS and discharge instructions reviewed w/ patient. Patient verbalized understanding. Eliquis starter pack delivered by pharmacy to the bedside before patient d/c'd.

## 2023-11-11 ENCOUNTER — Ambulatory Visit: Payer: Self-pay

## 2023-11-17 ENCOUNTER — Encounter: Payer: Self-pay | Admitting: Family Medicine

## 2023-11-17 ENCOUNTER — Ambulatory Visit (INDEPENDENT_AMBULATORY_CARE_PROVIDER_SITE_OTHER): Payer: Self-pay | Admitting: Family Medicine

## 2023-11-17 VITALS — BP 112/73 | HR 95 | Temp 98.2°F | Resp 16 | Ht 66.0 in | Wt 152.6 lb

## 2023-11-17 DIAGNOSIS — Z7689 Persons encountering health services in other specified circumstances: Secondary | ICD-10-CM

## 2023-11-17 DIAGNOSIS — D500 Iron deficiency anemia secondary to blood loss (chronic): Secondary | ICD-10-CM

## 2023-11-17 DIAGNOSIS — Z09 Encounter for follow-up examination after completed treatment for conditions other than malignant neoplasm: Secondary | ICD-10-CM

## 2023-11-17 DIAGNOSIS — F32A Depression, unspecified: Secondary | ICD-10-CM

## 2023-11-17 DIAGNOSIS — I2699 Other pulmonary embolism without acute cor pulmonale: Secondary | ICD-10-CM

## 2023-11-17 MED ORDER — FLUOXETINE HCL 10 MG PO TABS: 10.0000 mg | ORAL_TABLET | Freq: Every day | ORAL | 1 refills | Status: AC

## 2023-11-18 ENCOUNTER — Ambulatory Visit: Payer: Self-pay | Admitting: *Deleted

## 2023-11-18 ENCOUNTER — Ambulatory Visit
Admission: RE | Admit: 2023-11-18 | Discharge: 2023-11-18 | Disposition: A | Discharge status: Home / Self Care | Payer: Self-pay | Source: Ambulatory Visit | Attending: Obstetrics and Gynecology | Admitting: Obstetrics and Gynecology

## 2023-11-18 ENCOUNTER — Ambulatory Visit
Admission: RE | Admit: 2023-11-18 | Discharge: 2023-11-18 | Disposition: A | Discharge status: Home / Self Care | Payer: No Typology Code available for payment source | Source: Ambulatory Visit | Attending: Obstetrics and Gynecology | Admitting: Obstetrics and Gynecology

## 2023-11-18 VITALS — BP 119/82 | Wt 155.7 lb

## 2023-11-18 DIAGNOSIS — R928 Other abnormal and inconclusive findings on diagnostic imaging of breast: Secondary | ICD-10-CM

## 2023-11-18 DIAGNOSIS — Z1239 Encounter for other screening for malignant neoplasm of breast: Secondary | ICD-10-CM

## 2023-11-18 LAB — CBC WITH DIFFERENTIAL/PLATELET
Basophils Absolute: 0.1 10*3/uL (ref 0.0–0.2)
Basos: 2 %
EOS (ABSOLUTE): 0.3 10*3/uL (ref 0.0–0.4)
Eos: 7 %
Hematocrit: 33.8 % — ABNORMAL LOW (ref 34.0–46.6)
Hemoglobin: 9.3 g/dL — ABNORMAL LOW (ref 11.1–15.9)
Immature Grans (Abs): 0 10*3/uL (ref 0.0–0.1)
Immature Granulocytes: 0 %
Lymphocytes Absolute: 1.9 10*3/uL (ref 0.7–3.1)
Lymphs: 47 %
MCH: 22.4 pg — ABNORMAL LOW (ref 26.6–33.0)
MCHC: 27.5 g/dL — ABNORMAL LOW (ref 31.5–35.7)
MCV: 81 fL (ref 79–97)
Monocytes Absolute: 0.3 10*3/uL (ref 0.1–0.9)
Monocytes: 6 %
Neutrophils Absolute: 1.5 10*3/uL (ref 1.4–7.0)
Neutrophils: 38 %
Platelets: 445 10*3/uL (ref 150–450)
RBC: 4.15 x10E6/uL (ref 3.77–5.28)
RDW: 18.8 % — ABNORMAL HIGH (ref 11.7–15.4)
WBC: 4 10*3/uL (ref 3.4–10.8)

## 2023-11-18 NOTE — Patient Instructions (Addendum)
Explained breast self awareness with Olivia Alvarez. Patient did not need a Pap smear today due to last Pap smear and HPV typing was 09/23/2023 and patient has a history of a hysterectomy for benign reasons. Let her know that she doesn't need any further Pap smears due to her history of a hysterectomy for benign reasons. Referred patient to the [facility] for a diagnostic mammogram per recommendation. Appointment scheduled Tuesday, November 18, 2023 at 0940. Patient aware of appointment and will be there. Olivia Alvarez verbalized understanding.  Olivia Alvarez, Olivia Maser, RN 9:06 AM

## 2023-11-18 NOTE — Progress Notes (Addendum)
[name] is a 42 y.o. female who presents to [facility] clinic today with no complaints. Patient had a screening mammogram completed 09/23/2023 that additional imaging of bilateral breasts is recommended for follow up per referral.    Pap Smear: Pap smear not completed today. Last Pap smear was 09/23/2023 at [facility] clinic and was normal with negative HPV per patient. Per patient has history of an abnormal Pap smear in 2015 that a colposcopy was completed for follow up that was negative. Per patient all Pap smears have been normal since. Patient has a history of a hysterectomy 10/30/2023 for fibroids and AUB. Patient doesn't need any further Pap smears due to her history of a hysterectomy for benign reasons per ASCCP and BCCCP guidelines. Last Pap smear result is not available in Epic.   Physical exam: Breasts Breasts symmetrical. No skin abnormalities bilateral breasts. No nipple retraction bilateral breasts. No nipple discharge bilateral breasts. No lymphadenopathy. No lumps palpated bilateral breasts. No complaints of pain or tenderness on exam.       Pelvic/Bimanual Pap is not indicated today per BCCCP guidelines.   Smoking History: Patient has never smoked.   Patient Navigation: Patient education provided. Access to services provided for patient through BCCCP program.    Breast and Cervical Cancer Risk Assessment: Patient does not have family history of breast cancer, known genetic mutations, or radiation treatment to the chest before age 49. Patient does not have history of cervical dysplasia, immunocompromised, or DES exposure in-utero.  Risk Scores as of Encounter on 11/18/2023     Olivia Alvarez           5-year 0.56%   Lifetime 10.16%            Last calculated by Olivia Alvarez, CMA on 11/18/2023 at  8:31 AM        A: BCCCP exam without pap smear No complaints.  P: Referred patient to the [facility] for a diagnostic mammogram per  recommendation. Appointment scheduled Tuesday, November 18, 2023 at 0940.  Olivia Heidelberg, RN 11/18/2023 9:06 AM

## 2023-11-19 ENCOUNTER — Encounter: Payer: Self-pay | Admitting: Family Medicine

## 2023-11-19 NOTE — Progress Notes (Signed)
New Patient Office Visit  Subjective    Patient ID: Olivia Alvarez, female    DOB: 07-Oct-1981  Age: 42 y.o. MRN: [id number]  CC:  Chief Complaint  Patient presents with   Hospitalization Follow-up    HPI Olivia Alvarez presents to establish care and for hospital discharge follow up. Patient was recently in hospital for hysterectomy 2/2 sever anemia 2/2 chronic blood loss. After discharge patient developed a PE and was admitted again for tx. She is now on eliquis. She reports persistent intermittent chest discomfort.    Outpatient Encounter Medications as of 11/17/2023  Medication Sig   acetaminophen (TYLENOL) 500 MG tablet Take 2 tablets (1,000 mg total) by mouth every 6 (six) hours as needed.   [START ON 12/04/2023] apixaban (ELIQUIS) 5 MG TABS tablet Take 1 tablet (5 mg total) by mouth 2 (two) times daily.   APIXABAN (ELIQUIS) VTE STARTER PACK (10MG  AND 5MG ) Take as directed on package: start with two-5mg  tablets twice daily for 7 days. On day 8, switch to one-5mg  tablet twice daily.   Ascorbic Acid (VITAMIN C PO) Take 1 tablet by mouth daily.   cyclobenzaprine (FLEXERIL) 5 MG tablet Take 5 mg by mouth 3 (three) times daily as needed.   ferrous sulfate 325 (65 FE) MG tablet Take 1 tablet (325 mg total) by mouth 2 (two) times daily before a meal. (Patient taking differently: Take 325 mg by mouth daily.)   FLUoxetine (PROZAC) 10 MG tablet Take 1 tablet (10 mg total) by mouth daily.   IBUPROFEN PO Take 2 tablets by mouth as needed (pain). (Patient not taking: Reported on 11/17/2023)   oxycodone (OXY-IR) 5 MG capsule Take 5 mg by mouth every 4 (four) hours as needed for pain. (Patient not taking: Reported on 11/17/2023)   No facility-administered encounter medications on file as of 11/17/2023.    Past Medical History:  Diagnosis Date   Anemia    Anxiety    no meds   Dental crown present    Depression    no meds   Distal radius fracture, right 12/2016    displaced/comminuted   History of MRSA infection    arm    Past Surgical History:  Procedure Laterality Date   CYSTOSCOPY N/A 10/30/2023   Procedure: CYSTOSCOPY, EXCISION LEFT VULVAR LESION;  Surgeon: Willa Frater, MD;  Location: [facility];  Service: Gynecology;  Laterality: N/A;   OPEN REDUCTION INTERNAL FIXATION (ORIF) DISTAL RADIAL FRACTURE Right 12/26/2016   Procedure: OPEN REDUCTION INTERNAL FIXATION (ORIF) DISTAL RADIAL FRACTURE;  Surgeon: Betha Loa, MD;  Location: [facility];  Service: Orthopedics;  Laterality: Right;   ROBOTIC ASSISTED TOTAL HYSTERECTOMY WITH BILATERAL SALPINGO OOPHERECTOMY N/A 10/30/2023   Procedure: XI ROBOTIC ASSISTED TOTAL HYSTERECTOMY WITH BILATERAL SALPINGECTOMY;  Surgeon: Willa Frater, MD;  Location: [facility];  Service: Gynecology;  Laterality: N/A;  per dr Clint Lipps: "please consider TAP block"   TUBAL LIGATION Bilateral 02/15/2016   Procedure: POST PARTUM TUBAL LIGATION;  Surgeon: Willodean Rosenthal, MD;  Location: [facility];  Service: Gynecology;  Laterality: Bilateral;    Family History  Problem Relation Age of Onset   Cancer Father        throat   Heart disease Maternal Grandmother    Cancer Maternal Grandfather    Heart disease Maternal Grandfather    Diabetes Maternal Grandfather    Cancer Paternal Grandfather        lung    Social History   Socioeconomic History  Marital status: Married    Spouse name: Not on file   Number of children: Not on file   Years of education: Not on file   Highest education level: Not on file  Occupational History   Not on file  Tobacco Use   Smoking status: Never   Smokeless tobacco: Never  Vaping Use   Vaping status: Never Used  Substance and Sexual Activity   Alcohol use: Not Currently    Comment: occasionally   Drug use: No   Sexual activity: Not Currently    Birth control/protection: Pill  Other Topics Concern   Not on file  Social History Narrative   Not on file    Social Determinants of Health   Financial Resource Strain: Low Risk  (11/17/2023)   Overall Financial Resource Strain (CARDIA)    Difficulty of Paying Living Expenses: Not hard at all  Food Insecurity: No Food Insecurity (11/18/2023)   Hunger Vital Sign    Worried About Running Out of Food in the Last Year: Never true    Ran Out of Food in the Last Year: Never true  Transportation Needs: No Transportation Needs (11/18/2023)   PRAPARE - Administrator, Civil Service (Medical): No    Lack of Transportation (Non-Medical): No  Physical Activity: Inactive (11/17/2023)   Exercise Vital Sign    Days of Exercise per Week: 0 days    Minutes of Exercise per Session: 0 min  Stress: No Stress Concern Present (11/17/2023)   [employer] of Occupational Health - Occupational Stress Questionnaire    Feeling of Stress : Not at all  Social Connections: Moderately Isolated (11/17/2023)   Social Connection and Isolation Panel [NHANES]    Frequency of Communication with Friends and Family: Once a week    Frequency of Social Gatherings with Friends and Family: Never    Attends Religious Services: 1 to 4 times per year    Active Member of [employer] or Organizations: No    Attends Banker Meetings: Never    Marital Status: Married  Catering manager Violence: Not At Risk (11/04/2023)   Humiliation, Afraid, Rape, and Kick questionnaire    Fear of Current or Ex-Partner: No    Emotionally Abused: No    Physically Abused: No    Sexually Abused: No    Review of Systems  Psychiatric/Behavioral:  Positive for depression. The patient is nervous/anxious and has insomnia.   All other systems reviewed and are negative.       Objective    BP 112/73 (BP Location: Right Arm, Patient Position: Sitting, Cuff Size: Normal)   Pulse 95   Temp 98.2 F (36.8 C) (Oral)   Resp 16   Ht 5\' 6"  (1.676 m)   Wt 152 lb 9.6 oz (69.2 kg)   LMP 10/03/2023 (Approximate)   SpO2 97%   BMI 24.63  kg/m   Physical Exam Vitals and nursing note reviewed.  Constitutional:      General: She is not in acute distress. Cardiovascular:     Rate and Rhythm: Normal rate and regular rhythm.  Pulmonary:     Effort: Pulmonary effort is normal. No respiratory distress.     Breath sounds: Normal breath sounds. No wheezing.  Abdominal:     Palpations: Abdomen is soft.     Tenderness: There is no abdominal tenderness.  Neurological:     General: No focal deficit present.     Mental Status: She is alert and oriented to person, place, and  time.         Assessment & Plan:   Iron deficiency anemia due to chronic blood loss -     CBC with Differential/Platelet  PE (pulmonary thromboembolism) (HCC)  Depression, unspecified depression type  Hospital discharge follow-up  Encounter to establish care  Other orders -     FLUoxetine HCl; Take 1 tablet (10 mg total) by mouth daily.  Dispense: 30 tablet; Refill: 1     Return in about 4 weeks (around 12/15/2023) for follow up.   Tommie Raymond, MD

## 2023-12-18 ENCOUNTER — Telehealth: Payer: Self-pay | Admitting: Family Medicine

## 2023-12-18 NOTE — Telephone Encounter (Signed)
 Marland Kitchen

## 2023-12-25 ENCOUNTER — Ambulatory Visit: Payer: Self-pay | Admitting: Family Medicine

## 2024-10-28 ENCOUNTER — Encounter: Payer: Self-pay | Admitting: Obstetrics and Gynecology
